# Patient Record
Sex: Female | Born: 1995 | Hispanic: No | Marital: Married | Smoking: Never smoker
Health system: Southern US, Community
[De-identification: ages and names within clinical notes are randomized; demographics above are authoritative.]

## PROBLEM LIST (undated history)

## (undated) DIAGNOSIS — E0590 Thyrotoxicosis, unspecified without thyrotoxic crisis or storm: Secondary | ICD-10-CM

## (undated) HISTORY — DX: Thyrotoxicosis, unspecified without thyrotoxic crisis or storm: E05.90

---

## 2015-06-25 ENCOUNTER — Ambulatory Visit (INDEPENDENT_AMBULATORY_CARE_PROVIDER_SITE_OTHER): Payer: 59 | Admitting: Obstetrics & Gynecology

## 2015-06-25 ENCOUNTER — Encounter: Payer: Self-pay | Admitting: Obstetrics & Gynecology

## 2015-06-25 ENCOUNTER — Telehealth: Payer: Self-pay | Admitting: *Deleted

## 2015-06-25 VITALS — BP 117/66 | HR 95 | Resp 16 | Ht 67.0 in | Wt 150.0 lb

## 2015-06-25 DIAGNOSIS — N926 Irregular menstruation, unspecified: Secondary | ICD-10-CM | POA: Diagnosis not present

## 2015-06-25 DIAGNOSIS — R102 Pelvic and perineal pain: Secondary | ICD-10-CM | POA: Diagnosis not present

## 2015-06-25 DIAGNOSIS — Z113 Encounter for screening for infections with a predominantly sexual mode of transmission: Secondary | ICD-10-CM | POA: Diagnosis not present

## 2015-06-25 DIAGNOSIS — Z124 Encounter for screening for malignant neoplasm of cervix: Secondary | ICD-10-CM

## 2015-06-25 LAB — POCT URINE PREGNANCY: Preg Test, Ur: NEGATIVE

## 2015-06-25 NOTE — Addendum Note (Signed)
Addended by: Granville LewisLARK, Vincy Feliz L on: 06/25/2015 02:22 PM   Modules accepted: Orders

## 2015-06-25 NOTE — Telephone Encounter (Signed)
error 

## 2015-06-25 NOTE — Progress Notes (Signed)
   CLINIC ENCOUNTER NOTE  History:  19 y.o. G0 here today for evaluation of an abnormal period this cycle and pelvic pain since she got married about 1.5 years ago.   Reports 28 day cycles, but had one this cycle after 39 days. This has never occurred before.  She also described occasional low pelvic pain since she got married.  Pain is not related to intercourse; she cannot pinpoint any alleviating or worsening factors.  It is mild-moderate in intensity and can last for several hours.  Does not take any medications.  Patient is also very anxious and wants to get pregnant soon.    History reviewed. No pertinent past medical history.  History reviewed. No pertinent past surgical history.  The following portions of the patient's history were reviewed and updated as appropriate: allergies, current medications, past family history, past medical history, past social history, past surgical history and problem list.   Health Maintenance:  Never had a pap smear  Review of Systems:  Pertinent items noted in HPI and remainder of comprehensive ROS otherwise negative.  Objective:  Physical Exam BP 117/66 mmHg  Pulse 95  Resp 16  Ht 5\' 7"  (1.702 m)  Wt 150 lb (68.04 kg)  BMI 23.49 kg/m2  LMP 05/30/2015 CONSTITUTIONAL: Well-developed, well-nourished female in no acute distress.  HENT:  Normocephalic, atraumatic. External right and left ear normal. Oropharynx is clear and moist EYES: Conjunctivae and EOM are normal. Pupils are equal, round, and reactive to light. No scleral icterus.  NECK: Normal range of motion, supple, no masses SKIN: Skin is warm and dry. No rash noted. Not diaphoretic. No erythema. No pallor. NEUROLGIC: Alert and oriented to person, place, and time. Normal reflexes, muscle tone coordination. No cranial nerve deficit noted. PSYCHIATRIC: Normal mood and affect. Normal behavior. Normal judgment and thought content. CARDIOVASCULAR: Normal heart rate noted RESPIRATORY: Effort and  breath sounds normal, no problems with respiration noted ABDOMEN: Soft, no distention noted.   PELVIC: Normal appearing external genitalia; normal appearing vaginal mucosa and cervix.  No abnormal discharge noted.  Normal uterine size, no other palpable masses, mild uterine and adnexal tenderness to palpation. MUSCULOSKELETAL: Normal range of motion. No edema noted.  UPT in clinic: Negative  Assessment & Plan:  1. Pelvic pain in female 2. Irregular periods Reassured about having one irregular period and she is not pregnant; does not warrant any further evaluation unless irregular periods persist.  Patient desires imaging for her pelvic pain, ultrasound ordered.   Also discussed use of ovulation predictor kits to help with conception. - US Pelvis Complete; Future - US Transvaginal Non-OB; Future - Cytology - PAP Will follow up all results and manage accordingly.   Total face-to-face time with patient: 15 minutes. Over 50% of encounter was spent on counseling and coordination of care.   Jaynie CollinsUGONNA  Monroe Toure, MD, FACOG Attending Obstetrician & Gynecologist, [facility]

## 2015-06-25 NOTE — Patient Instructions (Signed)
Return to clinic for any scheduled appointments or for any gynecologic concerns as needed.   

## 2015-06-26 ENCOUNTER — Other Ambulatory Visit: Payer: 59

## 2015-06-29 LAB — CYTOLOGY - PAP

## 2016-03-11 ENCOUNTER — Encounter: Payer: Self-pay | Admitting: Advanced Practice Midwife

## 2016-03-16 ENCOUNTER — Ambulatory Visit (INDEPENDENT_AMBULATORY_CARE_PROVIDER_SITE_OTHER): Payer: Medicaid Other | Admitting: Family Medicine

## 2016-03-16 ENCOUNTER — Encounter: Payer: Self-pay | Admitting: Family Medicine

## 2016-03-16 VITALS — BP 110/70 | HR 73 | Ht 66.0 in | Wt 162.0 lb

## 2016-03-16 DIAGNOSIS — O039 Complete or unspecified spontaneous abortion without complication: Secondary | ICD-10-CM | POA: Diagnosis not present

## 2016-03-16 DIAGNOSIS — Z3169 Encounter for other general counseling and advice on procreation: Secondary | ICD-10-CM | POA: Diagnosis not present

## 2016-03-16 MED ORDER — CONCEPT OB 130-92.4-1 MG PO CAPS: 1.0000 | ORAL_CAPSULE | Freq: Every day | ORAL | 11 refills | Status: DC

## 2016-03-16 NOTE — Progress Notes (Signed)
   Subjective:    Patient ID: Joan Jackson is a 20 y.o. female presenting with Follow-up  on 03/16/2016  HPI: [facility] IUGS noted then gone. She is no longer bleeding.  Review of Systems  Constitutional: Negative for chills and fever.  Respiratory: Negative for shortness of breath.   Cardiovascular: Negative for chest pain.  Gastrointestinal: Negative for abdominal pain, nausea and vomiting.  Genitourinary: Negative for dysuria.  Skin: Negative for rash.      Objective:    BP 110/70   Pulse 73   Ht 5\' 6"  (1.676 m)   Wt 162 lb (73.5 kg)   LMP 02/22/2016 (Exact Date)   BMI 26.15 kg/m  Physical Exam  Constitutional: She is oriented to person, place, and time. She appears well-developed and well-nourished. No distress.  HENT:  Head: Normocephalic and atraumatic.  Eyes: No scleral icterus.  Neck: Neck supple.  Cardiovascular: Normal rate.   Pulmonary/Chest: Effort normal.  Abdominal: Soft.  Neurological: She is alert and oriented to person, place, and time.  Skin: Skin is warm and dry.  Psychiatric: She has a normal mood and affect.        Assessment & Plan:   Problem List Items Addressed This Visit    None    Visit Diagnoses    Encounter for preconception consultation    -  Primary   Desires pregnancy soon--begin PNV's   Relevant Medications   Prenat w/o A Vit-FeFum-FePo-FA (CONCEPT OB) 130-92.4-1 MG CAPS   SAB (spontaneous abortion)          Total face-to-face time with patient: 15 minutes. Over 50% of encounter was spent on counseling and coordination of care. Return if symptoms worsen or fail to improve.  Joan Jackson 03/16/2016 10:47 AM

## 2016-03-16 NOTE — Patient Instructions (Addendum)
Preparing for Pregnancy Before trying to become pregnant, make an appointment with your health care provider (preconception care). The goal is to help you have a healthy, safe pregnancy. At your first appointment, your health care provider will:   Do a complete physical exam, including a Pap test.  Take a complete medical history.  Give you advice and help you resolve any problems. PRECONCEPTION CHECKLIST Here is a list of the basics to cover with your health care provider at your preconception visit:  Medical history.  Tell your health care provider about any diseases you have had. Many diseases can affect your pregnancy.  Include your partner's medical history and family history.  Make sure you have been tested for sexually transmitted infections (STIs). These can affect your pregnancy. In some cases, they can be passed to your baby. Tell your health care provider about any history of STIs.  Make sure your health care provider knows about any previous problems you have had with conception or pregnancy.  Tell your health care provider about any medicine you take. This includes herbal supplements and over-the-counter medicines.  Make sure all your immunizations are up to date. You may need to make additional appointments.  Ask your health care provider if you need any vaccinations or if there are any you should avoid.  Diet.  It is especially important to eat a healthy, balanced diet with the right nutrients when you are pregnant.  Ask your health care provider to help you get to a healthy weight before pregnancy.  If you are overweight, you are at higher risk for certain complications. These include high blood pressure, diabetes, and preterm birth.  If you are underweight, you are more likely to have a low-birth-weight baby.  Lifestyle.  Tell your health care provider about lifestyle factors such as alcohol use, drug use, or smoking.  Describe any harmful substances you may  be exposed to at work or home. These can include chemicals, pesticides, and radiation.  Mental health.  Let your health care provider know if you have been feeling depressed or anxious.  Let your health care provider know if you have a history of substance abuse.  Let your health care provider know if you do not feel safe at home. HOME INSTRUCTIONS TO PREPARE FOR PREGNANCY Follow your health care provider's advice and instructions.   Keep an accurate record of your menstrual periods. This makes it easier for your health care provider to determine your baby's due date.  Begin taking prenatal vitamins and folic acid supplements daily. Take them as directed by your health care provider.  Eat a balanced diet. Get help from a nutrition counselor if you have questions or need help.  Get regular exercise. Try to be active for at least 30 minutes a day most days of the week.  Quit smoking, if you smoke.  Do not drink alcohol.  Do not take illegal drugs.  Get medical problems, such as diabetes or high blood pressure, under control.  If you have diabetes, make sure you do the following:  Have good blood sugar control. If you have type 1 diabetes, use multiple daily doses of insulin. Do not use split-dose or premixed insulin.  Have an eye exam by a qualified eye care professional trained in caring for people with diabetes.  Get evaluated by your health care provider for cardiovascular disease.  Get to a healthy weight. If you are overweight or obese, reduce your weight with the help of a qualified health   professional such as a registered dietitian. Ask your health care provider what the right weight range is for you. HOW DO I KNOW I AM PREGNANT? You may be pregnant if you have been sexually active and you miss your period. Symptoms of early pregnancy include:   Mild cramping.  Very light vaginal bleeding (spotting).  Feeling unusually tired.  Morning sickness. If you have any of  these symptoms, take a home pregnancy test. These tests look for a hormone called human chorionic gonadotropin (hCG) in your urine. Your body begins to make this hormone during early pregnancy. These tests are very accurate. Wait until at least the first day you miss your period to take one. If you get a positive result, call your health care provider to make appointments for prenatal care. WHAT SHOULD I DO IF I BECOME PREGNANT?  Make an appointment with your health care provider by week 12 of your pregnancy at the latest.  Do not smoke. Smoking can be harmful to your baby.  Do not drink alcoholic beverages. Alcohol is related to a number of birth defects.  Avoid toxic odors and chemicals.  You may continue to have sexual intercourse if it does not cause pain or other problems, such as vaginal bleeding.   This information is not intended to replace advice given to you by your health care provider. Make sure you discuss any questions you have with your health care provider.   Document Released: 06/09/2008 Document Revised: 07/18/2014 Document Reviewed: 06/03/2013 Elsevier Interactive Patient Education 2016 Elsevier Inc.  

## 2017-01-30 ENCOUNTER — Telehealth: Payer: Self-pay | Admitting: *Deleted

## 2017-01-30 NOTE — Telephone Encounter (Signed)
Pt called stating that her LMP was 12/24/16.  She has taken a UPT which was neg.  Explained that sometimes women have an anovulatory cycle.  Appt made for 02/13/17 with Dr Penne LashLeggett.  If pt gets her period before the appt will call and cancel.

## 2017-02-13 ENCOUNTER — Ambulatory Visit: Payer: Medicaid Other | Admitting: Obstetrics & Gynecology

## 2017-02-28 ENCOUNTER — Ambulatory Visit (INDEPENDENT_AMBULATORY_CARE_PROVIDER_SITE_OTHER): Payer: BLUE CROSS/BLUE SHIELD | Admitting: Obstetrics and Gynecology

## 2017-02-28 ENCOUNTER — Encounter: Payer: Self-pay | Admitting: Obstetrics and Gynecology

## 2017-02-28 VITALS — BP 112/65 | HR 70 | Resp 16 | Ht 66.0 in | Wt 157.0 lb

## 2017-02-28 DIAGNOSIS — Z3169 Encounter for other general counseling and advice on procreation: Secondary | ICD-10-CM | POA: Diagnosis not present

## 2017-02-28 LAB — TSH: TSH: 2.12 mIU/L

## 2017-02-28 MED ORDER — VITAFOL ULTRA 29-0.6-0.4-200 MG PO CAPS: 1.0000 | ORAL_CAPSULE | Freq: Every day | ORAL | 12 refills | Status: DC

## 2017-02-28 NOTE — Progress Notes (Signed)
21 yo G1P0010 here for preconception counseling. Patient states that she was pregnant last year and experienced a miscarriage but was also told that she wasn't pregnant. Patient unclear as to what happened. Patient reports trying to conceive for the past year without success. She denies any medical problems. She is not a smoker. She reports monthly period lasting 5 days. Her cycles are 30-35 days long. She reports having intercourse 4-5 times per month. She states that her husband is also healthy and non smoker. She denies any pelvic pain or abnormal discharge. She also reports feeling extremely fatigued in the past few months and experiences heart palpitation occasionally which resolves spontaneously.  No past medical history on file. No past surgical history on file. Family History  Problem Relation Age of Onset  . Diabetes Mother   . Hypertension Mother   . Anemia Mother   . Diabetes Father   . Anemia Sister    Social History  Substance Use Topics  . Smoking status: Never Smoker  . Smokeless tobacco: Never Used  . Alcohol use No   ROS See pertinent in HPI  Blood pressure 112/65, pulse 70, resp. rate 16, height 5\' 6"  (1.676 m), weight 157 lb (71.2 kg), last menstrual period 02/17/2017. GENERAL: Well-developed, well-nourished female in no acute distress.  ABDOMEN: Soft, nontender, nondistended.  PELVIC: Not performed EXTREMITIES: No cyanosis, clubbing, or edema, 2+ distal pulses.  A/P 21 yo G1P0010 here for preconception counseling.  - Discussed tracking ovulation and timing of intercourse during the fertile period - Discussed trying for a year  - Rx prenatal vitamins provided - Will check TSH and A1c - Advised patient to follow up with PCP for evaluation of heart palpitation - RTC prn. Last pap smear was normal in 2016

## 2017-03-01 LAB — HEMOGLOBIN A1C
Hgb A1c MFr Bld: 5.4 % (ref <5.7)
Mean Plasma Glucose: 108 mg/dL

## 2017-04-11 ENCOUNTER — Ambulatory Visit (INDEPENDENT_AMBULATORY_CARE_PROVIDER_SITE_OTHER): Payer: BLUE CROSS/BLUE SHIELD | Admitting: Obstetrics & Gynecology

## 2017-04-11 ENCOUNTER — Encounter: Payer: Self-pay | Admitting: Obstetrics & Gynecology

## 2017-04-11 VITALS — BP 123/66 | HR 80 | Resp 16 | Ht 66.0 in | Wt 162.0 lb

## 2017-04-11 DIAGNOSIS — Z3202 Encounter for pregnancy test, result negative: Secondary | ICD-10-CM

## 2017-04-11 DIAGNOSIS — N912 Amenorrhea, unspecified: Secondary | ICD-10-CM | POA: Diagnosis not present

## 2017-04-11 LAB — POCT URINE PREGNANCY: Preg Test, Ur: NEGATIVE

## 2017-04-11 MED ORDER — MEDROXYPROGESTERONE ACETATE 10 MG PO TABS: 10.0000 mg | ORAL_TABLET | Freq: Every day | ORAL | 2 refills | Status: DC

## 2017-04-11 NOTE — Progress Notes (Signed)
   Subjective:    Patient ID: Joan Jackson, female    DOB: 1995/11/07, 21 y.o.   MRN: [id number]  HPI 21 yo M [place] G1P0A1 s/p spontaneous miscarriage 7/17 here today because she would like to be pregnant. Her LMP was 02-11-17. She is concerned because her cycles are lengthening. She has been trying to have a baby for 3 years.   Review of Systems She says that her husband had a semen analysis a while ago and it was normal.    Objective:   Physical Exam Well nourished, well hydrated Asian female, no apparent distress Breathing, conversing, and ambulating normally     Assessment & Plan:  She is taking a MVI Rec RE, She would like a female versus a female. Since I don't know of any female REs here she will do research and then call back and request a referral to her physician of choice. I offered to prescribe provera for a progestin challenge. I will send this to her pharmacy.

## 2017-05-30 ENCOUNTER — Telehealth: Payer: Self-pay | Admitting: *Deleted

## 2017-05-30 MED ORDER — MEDROXYPROGESTERONE ACETATE 10 MG PO TABS: 10.0000 mg | ORAL_TABLET | Freq: Every day | ORAL | 2 refills | Status: DC

## 2017-05-30 NOTE — Telephone Encounter (Signed)
RF given for Provera 10 mg.  Pt to do a preg test before starting Provera.  If she has a period she will call office to report what her next step is.

## 2017-06-14 ENCOUNTER — Telehealth: Payer: Self-pay | Admitting: *Deleted

## 2017-06-14 NOTE — Telephone Encounter (Signed)
Pt called to say that she did not have a bleed from the Provera 10x10.  Per Dr Marice Potterove recommend seeing an RE.  She prefers a female.  I gave her the [facility] numbers [facility], [facility] and Dr April MansonYalcinkaya.  If any needed a referral from us we would be glad to send one.

## 2019-02-25 ENCOUNTER — Encounter: Payer: BLUE CROSS/BLUE SHIELD | Admitting: Obstetrics & Gynecology

## 2019-07-22 DIAGNOSIS — E538 Deficiency of other specified B group vitamins: Secondary | ICD-10-CM | POA: Insufficient documentation

## 2019-07-22 DIAGNOSIS — E559 Vitamin D deficiency, unspecified: Secondary | ICD-10-CM | POA: Insufficient documentation

## 2020-06-11 DIAGNOSIS — R7301 Impaired fasting glucose: Secondary | ICD-10-CM | POA: Insufficient documentation

## 2020-07-11 NOTE — L&D Delivery Note (Signed)
OB/GYN Faculty Practice Delivery Note  Joan Jackson is a 25 y.o. G3P0020 s/p NSVD at [redacted]w[redacted]d. She was admitted for IOL for Post Dates.   ROM: unknown time with clear  fluid GBS Status: Negative Maximum Maternal Temperature: 101.6 during labor, treated with Ampicillin and Gentamycin  Labor Progress: Presented for IOL, received cytotec, progressed to fully, febrile to 101.6 and treated with antibiotics  Delivery Date/Time: 4967 on 03/01/2021 Delivery: Called to room and patient was complete and pushing. Head delivered. No nuchal cord present. Shoulder and body delivered in usual fashion. Infant with spontaneous cry, placed on mother's abdomen, dried and stimulated. Cord clamped x 2 after 1-minute delay, and cut by family member. Cord blood drawn. Placenta delivered spontaneously with gentle cord traction. Fundus firm with massage and Pitocin. Labia, perineum, vagina, and cervix inspected and a small labial tear was repaired with a 4.0 monocryl suture.  Of note, I was called in to stand by for delivery as provider Joan Jackson, CNM) caring for patient was called into a different patient's room. I delivered baby and stepped out and the placental delivery and repair was completed by Joan Jackson  Placenta: intact, 3v cord, to L&D Complications: Chorioamnionitis  Lacerations: Left labial EBL: 406 Analgesia: epidural   Infant: Girl  APGARs 9,9  3220g  Warner Mccreedy, MD, MPH OB Fellow, Faculty [facility] for [facility], [facility] 03/01/2021, 9:39 AM

## 2020-07-20 ENCOUNTER — Encounter: Payer: Self-pay | Admitting: *Deleted

## 2020-07-27 ENCOUNTER — Telehealth: Payer: Self-pay | Admitting: *Deleted

## 2020-07-27 NOTE — Telephone Encounter (Signed)
Could not get in contact with patient. Left message with sister to call and reschedule appointment due to office opening at 10:00 AM, if possible.

## 2020-07-28 ENCOUNTER — Ambulatory Visit (INDEPENDENT_AMBULATORY_CARE_PROVIDER_SITE_OTHER): Payer: Medicaid Other | Admitting: Advanced Practice Midwife

## 2020-07-28 DIAGNOSIS — Z3A10 10 weeks gestation of pregnancy: Secondary | ICD-10-CM

## 2020-07-29 ENCOUNTER — Encounter: Payer: Self-pay | Admitting: Obstetrics and Gynecology

## 2020-07-29 NOTE — Progress Notes (Signed)
Appt cancelled due to inclement weather

## 2020-07-29 NOTE — Progress Notes (Unsigned)
Pt thinks last pap was 2021- negative

## 2020-07-29 NOTE — Progress Notes (Unsigned)
Nausea/Vomiting and poor appetite

## 2020-07-30 ENCOUNTER — Other Ambulatory Visit: Payer: Self-pay

## 2020-07-30 ENCOUNTER — Other Ambulatory Visit (HOSPITAL_COMMUNITY)
Admission: RE | Admit: 2020-07-30 | Discharge: 2020-07-30 | Disposition: A | Discharge status: Home / Self Care | Payer: Medicaid Other | Source: Ambulatory Visit | Attending: Obstetrics and Gynecology | Admitting: Obstetrics and Gynecology

## 2020-07-30 ENCOUNTER — Ambulatory Visit (INDEPENDENT_AMBULATORY_CARE_PROVIDER_SITE_OTHER): Payer: Medicaid Other | Admitting: Obstetrics and Gynecology

## 2020-07-30 ENCOUNTER — Encounter: Payer: Self-pay | Admitting: Obstetrics and Gynecology

## 2020-07-30 VITALS — BP 134/76 | HR 112 | Wt 167.0 lb

## 2020-07-30 DIAGNOSIS — Z3A10 10 weeks gestation of pregnancy: Secondary | ICD-10-CM

## 2020-07-30 DIAGNOSIS — Z3480 Encounter for supervision of other normal pregnancy, unspecified trimester: Secondary | ICD-10-CM | POA: Insufficient documentation

## 2020-07-30 DIAGNOSIS — E039 Hypothyroidism, unspecified: Secondary | ICD-10-CM | POA: Insufficient documentation

## 2020-07-30 MED ORDER — DOXYLAMINE-PYRIDOXINE 10-10 MG PO TBEC: 2.0000 | DELAYED_RELEASE_TABLET | Freq: Every day | ORAL | 5 refills | Status: DC

## 2020-07-30 NOTE — Addendum Note (Signed)
Addended by: Granville Lewis on: 07/30/2020 11:19 AM   Modules accepted: Orders

## 2020-07-30 NOTE — Progress Notes (Signed)
Bedside U/S shows single IUP with FHT of 188 BPM and CRL measures 34.37mm  GA is [redacted]w[redacted]d

## 2020-07-30 NOTE — Progress Notes (Signed)
INITIAL PRENATAL VISIT NOTE  Subjective:  Coretta Leisey is a 25 y.o. G3P0020 at [redacted]w[redacted]d by LMP being seen today for her initial prenatal visit. This is a planned pregnancy. She and partner are happy with the pregnancy. She was using nothing for birth control previously. She has an obstetric history significant for 2 x SAB (? Reports one tubal pregnancy). She has a medical history significant for hypothyroidism.  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 Two miscarriages (reports one in tube that resolved on its own). Had trigger shot (HCG) only, and got pregnant with timed intercourse on the second month. Reports she has a history of irregular periods. Was on progesterone until 8 weeks.   Patient reports nausea. Reports some blood in her vomit, very small amount. Also with headache.   Contractions: Not present. Vag. Bleeding: None.  Movement: Absent. Denies leaking of fluid.    Past Medical History:  Diagnosis Date  . Hyperthyroidism     History reviewed. No pertinent surgical history.  OB History  Gravida Para Term Preterm AB Living  3 0 0 0 2 0  SAB IAB Ectopic Multiple Live Births  2 0 0 0      # Outcome Date GA Lbr Len/2nd Weight Sex Delivery Anes PTL Lv  3 Current           2 SAB           1 SAB             Social History   Socioeconomic History  . Marital status: Married    Spouse name: Not on file  . Number of children: Not on file  . Years of education: Not on file  . Highest education level: Not on file  Occupational History  . Occupation: Homemaker  Tobacco Use  . Smoking status: Never Smoker  . Smokeless tobacco: Never Used  Substance and Sexual Activity  . Alcohol use: No    Alcohol/week: 0.0 standard drinks  . Drug use: No  . Sexual activity: Yes    Partners: Male    Birth control/protection: None  Other Topics Concern  . Not on file  Social History Narrative  . Not on file   Social Determinants of Health   Financial Resource  Strain: Not on file  Food Insecurity: Not on file  Transportation Needs: Not on file  Physical Activity: Not on file  Stress: Not on file  Social Connections: Not on file    Family History  Problem Relation Age of Onset  . Diabetes Mother   . Hypertension Mother   . Anemia Mother   . Diabetes Father   . Anemia Sister     Current Outpatient Medications:  .  Doxylamine-Pyridoxine (DICLEGIS) 10-10 MG TBEC, Take 2 tablets by mouth at bedtime. If symptoms persist, add one tablet in the morning and one in the afternoon, Disp: 100 tablet, Rfl: 5 .  levothyroxine (SYNTHROID) 25 MCG tablet, Take by mouth., Disp: , Rfl:  .  Prenat-Fe Poly-Methfol-FA-DHA (VITAFOL ULTRA) 29-0.6-0.4-200 MG CAPS, Take 1 tablet by mouth daily., Disp: 30 capsule, Rfl: 12 .  Vitamin D, Ergocalciferol, (DRISDOL) 1.25 MG (50000 UNIT) CAPS capsule, SMARTSIG:1 Capsule(s) By Mouth, Disp: , Rfl:   Allergies  Allergen Reactions  . Bee Venom Swelling    Review of Systems: Negative except for what is mentioned in HPI.  Objective:   Vitals:   07/29/20 1332  BP: 134/76  Pulse: (!) 112  Weight: 167  lb (75.8 kg)    Fetal Status: Fetal Heart Rate (bpm): 188   Movement: Absent     Physical Exam: BP 134/76   Pulse (!) 112   Wt 167 lb (75.8 kg)   LMP 05/18/2020   BMI 26.95 kg/m  CONSTITUTIONAL: Well-developed, well-nourished female in no acute distress.  NEUROLOGIC: Alert and oriented to person, place, and time. Normal reflexes, muscle tone coordination. No cranial nerve deficit noted. PSYCHIATRIC: Normal mood and affect. Normal behavior. Normal judgment and thought content. SKIN: Skin is warm and dry. No rash noted. Not diaphoretic. No erythema. No pallor. HENT:  Normocephalic, atraumatic, External right and left ear normal. Oropharynx is clear and moist EYES: Conjunctivae and EOM are normal. Pupils are equal, round, and reactive to light. No scleral icterus.  NECK: Normal range of motion, supple, no  masses CARDIOVASCULAR: Normal heart rate noted, regular rhythm RESPIRATORY: Effort and breath sounds normal, no problems with respiration noted BREASTS: symmetric, non-tender, no masses palpable ABDOMEN: Soft, nontender, nondistended, gravid. GU: normal appearing external female genitalia, nulliparous normal appearing cervix, scant white discharge in vagina, no lesions noted Bimanual: 10 weeks sized uterus, no adnexal tenderness or palpable lesions noted MUSCULOSKELETAL: Normal range of motion. EXT:  No edema and no tenderness. 2+ distal pulses.   Assessment and Plan:  Pregnancy: G3P0020 at [redacted]w[redacted]d by LMP  1. Supervision of other normal pregnancy, antepartum - Obstetric panel - HIV antibody (with reflex) - Hepatitis C Antibody - Culture, OB Urine - Hemoglobinopathy Evaluation - GC/Chlamydia probe amp ([place])not at [facility] - [place] bedside; Future - CHL AMB BABYSCRIPTS OPT IN - TSH - defers genetic testing until next visit - defers pap until post partum - declines flu until next time - has had 2 COVID vaccines, not had booster, recommended - Reviewed [facility] for [employer] structure, multiple providers, fellows, medical students, virtual visits, MyChart.  - reviewed that she may be taken care of by female provider, physician at hospital  2. [redacted] weeks gestation of pregnancy  3. Hypothyroidism, unspecified type - TSH - [place] MFM OB DETAIL +14 WK; Future   Preterm labor symptoms and general obstetric precautions including but not limited to vaginal bleeding, contractions, leaking of fluid and fetal movement were reviewed in detail with the patient.  Please refer to After Visit Summary for other counseling recommendations.   Return in about 4 weeks (around 08/27/2020) for high OB, in person.  Conan Bowens 07/30/2020 11:16 AM

## 2020-07-31 LAB — GC/CHLAMYDIA PROBE AMP (~~LOC~~) NOT AT ARMC
Chlamydia: NEGATIVE
Comment: NEGATIVE
Comment: NORMAL
Neisseria Gonorrhea: NEGATIVE

## 2020-08-03 LAB — OBSTETRIC PANEL
Absolute Monocytes: 837 cells/uL (ref 200–950)
Antibody Screen: NOT DETECTED
Basophils Absolute: 46 cells/uL (ref 0–200)
Basophils Relative: 0.5 %
Eosinophils Absolute: 246 cells/uL (ref 15–500)
Eosinophils Relative: 2.7 %
HCT: 39.1 % (ref 35.0–45.0)
Hemoglobin: 13.3 g/dL (ref 11.7–15.5)
Hepatitis B Surface Ag: NONREACTIVE
Lymphs Abs: 1629 cells/uL (ref 850–3900)
MCH: 28.4 pg (ref 27.0–33.0)
MCHC: 34 g/dL (ref 32.0–36.0)
MCV: 83.4 fL (ref 80.0–100.0)
MPV: 11.2 fL (ref 7.5–12.5)
Monocytes Relative: 9.2 %
Neutro Abs: 6343 cells/uL (ref 1500–7800)
Neutrophils Relative %: 69.7 %
Platelets: 294 10*3/uL (ref 140–400)
RBC: 4.69 10*6/uL (ref 3.80–5.10)
RDW: 12.8 % (ref 11.0–15.0)
RPR Ser Ql: NONREACTIVE
Rubella: 10.2 Index
Total Lymphocyte: 17.9 %
WBC: 9.1 10*3/uL (ref 3.8–10.8)

## 2020-08-03 LAB — HEPATITIS C ANTIBODY
Hepatitis C Ab: NONREACTIVE
SIGNAL TO CUT-OFF: 0.01 (ref <1.00)

## 2020-08-03 LAB — TSH: TSH: 2.26 mIU/L

## 2020-08-03 LAB — HIV ANTIBODY (ROUTINE TESTING W REFLEX): HIV 1&2 Ab, 4th Generation: NONREACTIVE

## 2020-08-03 LAB — HEMOGLOBINOPATHY EVALUATION
Fetal Hemoglobin Testing: 0.3 % (ref 0.0–1.9)
HCT: 41 % (ref 35.0–45.0)
Hemoglobin A2 - HGBRFX: 3 % (ref 2.2–3.2)
Hemoglobin: 13.4 g/dL (ref 11.7–15.5)
Hgb A: 96.7 % (ref >96.0)
MCH: 27.7 pg (ref 27.0–33.0)
MCV: 84.7 fL (ref 80.0–100.0)
RBC: 4.84 10*6/uL (ref 3.80–5.10)
RDW: 12.9 % (ref 11.0–15.0)

## 2020-08-06 ENCOUNTER — Telehealth: Payer: Self-pay | Admitting: *Deleted

## 2020-08-06 MED ORDER — LEVOTHYROXINE SODIUM 25 MCG PO TABS: 25.0000 ug | ORAL_TABLET | Freq: Every day | ORAL | 6 refills | Status: DC

## 2020-08-06 NOTE — Telephone Encounter (Signed)
Pt notified of normal TSH and per Dr Earlene Plater she is to stay on the current dose of Synthroid .  RF sent to [facility] in [place]

## 2020-08-27 ENCOUNTER — Ambulatory Visit (INDEPENDENT_AMBULATORY_CARE_PROVIDER_SITE_OTHER): Payer: Medicaid Other | Admitting: Obstetrics and Gynecology

## 2020-08-27 ENCOUNTER — Other Ambulatory Visit: Payer: Self-pay

## 2020-08-27 ENCOUNTER — Encounter: Payer: Self-pay | Admitting: Obstetrics and Gynecology

## 2020-08-27 VITALS — BP 121/71 | HR 86 | Wt 171.0 lb

## 2020-08-27 DIAGNOSIS — E039 Hypothyroidism, unspecified: Secondary | ICD-10-CM

## 2020-08-27 DIAGNOSIS — Z3480 Encounter for supervision of other normal pregnancy, unspecified trimester: Secondary | ICD-10-CM

## 2020-08-27 NOTE — Progress Notes (Signed)
   PRENATAL VISIT NOTE  Subjective:  Joan Jackson is a 25 y.o. G3P0020 at [redacted]w[redacted]d being seen today for ongoing prenatal care.  She is currently monitored for the following issues for this low-risk pregnancy and has Supervision of other normal pregnancy, antepartum and Hypothyroidism on their problem list.  Patient reports nausea has improved.  Contractions: Not present. Vag. Bleeding: None.  Movement: Absent. Denies leaking of fluid.   The following portions of the patient's history were reviewed and updated as appropriate: allergies, current medications, past family history, past medical history, past social history, past surgical history and problem list.   Objective:   Vitals:   08/27/20 0940  BP: 121/71  Pulse: 86  Weight: 171 lb (77.6 kg)    Fetal Status: Fetal Heart Rate (bpm): 155   Movement: Absent     General:  Alert, oriented and cooperative. Patient is in no acute distress.  Skin: Skin is warm and dry. No rash noted.   Cardiovascular: Normal heart rate noted  Respiratory: Normal respiratory effort, no problems with respiration noted  Abdomen: Soft, gravid, appropriate for gestational age.  Pain/Pressure: Absent     Pelvic: Cervical exam deferred        Extremities: Normal range of motion.  Edema: None  Mental Status: Normal mood and affect. Normal behavior. Normal judgment and thought content.   Assessment and Plan:  Pregnancy: G3P0020 at [redacted]w[redacted]d  1. Hypothyroidism, unspecified type Cont current dose synthroid  2. Supervision of other normal pregnancy, antepartum Panorama today Reviewed pregnancy symptoms, round ligament pain, back pain expectations Encouraged light exercise  Preterm labor symptoms and general obstetric precautions including but not limited to vaginal bleeding, contractions, leaking of fluid and fetal movement were reviewed in detail with the patient. Please refer to After Visit Summary for other counseling recommendations.   Return in about 4 weeks  (around 09/24/2020) for in person, low OB.  Future Appointments  Date Time Provider Department Center  09/24/2020  3:00 PM Lesly Dukes, MD [facility]    Conan Bowens, MD

## 2020-08-27 NOTE — Progress Notes (Signed)
Panorama drawn

## 2020-08-28 LAB — URINE CULTURE
MICRO NUMBER:: 11547000
Result:: NO GROWTH
SPECIMEN QUALITY:: ADEQUATE

## 2020-09-03 ENCOUNTER — Encounter: Payer: Self-pay | Admitting: *Deleted

## 2020-09-03 DIAGNOSIS — Z3480 Encounter for supervision of other normal pregnancy, unspecified trimester: Secondary | ICD-10-CM

## 2020-09-10 ENCOUNTER — Telehealth: Payer: Self-pay | Admitting: *Deleted

## 2020-09-10 NOTE — Telephone Encounter (Signed)
Patient wanted to know what medication she is able to take in her 2nd trimester for nasal congestion. Patient advised to take Mucinex-D and or a saline drops, not a nasal spray. Patient also stated that she has been having issues with her blood pressure, but does not know what the normal range is. Patient advised to take BP after 15 minutes of resting/sitting and call back for reading. Patient called back at 10:45 AM with BP reading of 128/74 and 90 pulse. Explained to patient what can make a BP abnormal and what a normal BP is. Patient agreed to all stated.

## 2020-09-14 ENCOUNTER — Encounter: Payer: Self-pay | Admitting: *Deleted

## 2020-09-14 DIAGNOSIS — Z3480 Encounter for supervision of other normal pregnancy, unspecified trimester: Secondary | ICD-10-CM

## 2020-09-21 ENCOUNTER — Telehealth: Payer: Self-pay | Admitting: *Deleted

## 2020-09-21 ENCOUNTER — Other Ambulatory Visit: Payer: Self-pay | Admitting: Obstetrics and Gynecology

## 2020-09-21 MED ORDER — LEVOTHYROXINE SODIUM 25 MCG PO TABS: 25.0000 ug | ORAL_TABLET | Freq: Every day | ORAL | 3 refills | Status: DC

## 2020-09-21 NOTE — Telephone Encounter (Signed)
pT'S rx FOR sYNTHROID CHANGED TO TAKE 25 MG DAILY EXCEPT ON tUESDAY AND sAT SHE IS TO TAKE 50 MG.

## 2020-09-24 ENCOUNTER — Ambulatory Visit (INDEPENDENT_AMBULATORY_CARE_PROVIDER_SITE_OTHER): Payer: Medicaid Other | Admitting: Obstetrics & Gynecology

## 2020-09-24 ENCOUNTER — Other Ambulatory Visit: Payer: Self-pay

## 2020-09-24 VITALS — BP 130/65 | HR 93 | Wt 172.0 lb

## 2020-09-24 DIAGNOSIS — E039 Hypothyroidism, unspecified: Secondary | ICD-10-CM

## 2020-09-24 DIAGNOSIS — Z3480 Encounter for supervision of other normal pregnancy, unspecified trimester: Secondary | ICD-10-CM

## 2020-09-24 MED ORDER — LEVOTHYROXINE SODIUM 50 MCG PO TABS: ORAL_TABLET | ORAL | 3 refills | Status: DC

## 2020-09-24 MED ORDER — LEVOTHYROXINE SODIUM 25 MCG PO CAPS: 1.0000 | ORAL_CAPSULE | Freq: Every day | ORAL | 6 refills | Status: DC

## 2020-09-24 NOTE — Progress Notes (Signed)
   PRENATAL VISIT NOTE  Subjective:  Joan Jackson is a 25 y.o. G3P0020 at [redacted]w[redacted]d being seen today for ongoing prenatal care.  She is currently monitored for the following issues for this low-risk pregnancy and has Supervision of other normal pregnancy, antepartum and Hypothyroidism on their problem list.  Patient reports no complaints.  Contractions: Not present. Vag. Bleeding: None.  Movement: Absent. Denies leaking of fluid.   The following portions of the patient's history were reviewed and updated as appropriate: allergies, current medications, past family history, past medical history, past social history, past surgical history and problem list.   Objective:   Vitals:   09/24/20 1453  BP: 130/65  Pulse: 93  Weight: 172 lb (78 kg)    Fetal Status: Fetal Heart Rate (bpm): 146   Movement: Absent     General:  Alert, oriented and cooperative. Patient is in no acute distress.  Skin: Skin is warm and dry. No rash noted.   Cardiovascular: Normal heart rate noted  Respiratory: Normal respiratory effort, no problems with respiration noted  Abdomen: Soft, gravid, appropriate for gestational age.  Pain/Pressure: Absent     Pelvic: Cervical exam deferred        Extremities: Normal range of motion.  Edema: None  Mental Status: Normal mood and affect. Normal behavior. Normal judgment and thought content.   Assessment and Plan:  Pregnancy: G3P0020 at [redacted]w[redacted]d 1. Hypothyroidism, unspecified type -Dr. Jeannie Fend has her on 50 mcg Tuesdays and Saturdays and 25 mcg every other day.  This was refilled.   2. Supervision of other normal pregnancy, antepartum - Alpha fetoprotein, maternal - [place] ordered 10/02/20 -Pt to download Baby Rx  Preterm labor symptoms and general obstetric precautions including but not limited to vaginal bleeding, contractions, leaking of fluid and fetal movement were reviewed in detail with the patient. Please refer to After Visit Summary for other counseling recommendations.   No  follow-ups on file.  Future Appointments  Date Time Provider Department Center  09/24/2020  3:00 PM Lesly Dukes, MD [facility]  10/02/2020  9:15 AM [facility] NURSE [facility]  10/02/2020  9:30 AM [facility] US3 [facility]    Elsie Lincoln, MD

## 2020-09-25 LAB — ALPHA FETOPROTEIN, MATERNAL
AFP MoM: 0.48
AFP, Serum: 20.9 ng/mL
Calc'd Gestational Age: 18.4 weeks
Maternal Wt: 172 [lb_av]
Risk for ONTD: <1
Twins-AFP: 1

## 2020-09-30 ENCOUNTER — Other Ambulatory Visit: Payer: Medicaid Other

## 2020-09-30 ENCOUNTER — Ambulatory Visit: Payer: Medicaid Other

## 2020-10-02 ENCOUNTER — Ambulatory Visit (HOSPITAL_BASED_OUTPATIENT_CLINIC_OR_DEPARTMENT_OTHER): Payer: Medicaid Other

## 2020-10-02 ENCOUNTER — Other Ambulatory Visit: Payer: Self-pay | Admitting: *Deleted

## 2020-10-02 ENCOUNTER — Encounter: Payer: Self-pay | Admitting: *Deleted

## 2020-10-02 ENCOUNTER — Ambulatory Visit: Payer: Medicaid Other | Attending: Obstetrics and Gynecology | Admitting: *Deleted

## 2020-10-02 ENCOUNTER — Other Ambulatory Visit: Payer: Self-pay

## 2020-10-02 VITALS — BP 125/69 | HR 78

## 2020-10-02 DIAGNOSIS — O99282 Endocrine, nutritional and metabolic diseases complicating pregnancy, second trimester: Secondary | ICD-10-CM | POA: Diagnosis not present

## 2020-10-02 DIAGNOSIS — Z3A19 19 weeks gestation of pregnancy: Secondary | ICD-10-CM | POA: Insufficient documentation

## 2020-10-02 DIAGNOSIS — E039 Hypothyroidism, unspecified: Secondary | ICD-10-CM

## 2020-10-02 DIAGNOSIS — Z363 Encounter for antenatal screening for malformations: Secondary | ICD-10-CM | POA: Insufficient documentation

## 2020-10-02 DIAGNOSIS — Z362 Encounter for other antenatal screening follow-up: Secondary | ICD-10-CM

## 2020-10-19 ENCOUNTER — Telehealth: Payer: Self-pay | Admitting: *Deleted

## 2020-10-19 NOTE — Telephone Encounter (Signed)
Patient called to make sure that it is okay for her to take Miralex for constipation. Patient advised to take as directed on the bottle and that it might take a few days to work. Patient agreed to plan of care.

## 2020-10-22 ENCOUNTER — Other Ambulatory Visit: Payer: Self-pay | Admitting: *Deleted

## 2020-10-22 MED ORDER — LEVOTHYROXINE SODIUM 25 MCG PO CAPS: ORAL_CAPSULE | ORAL | 6 refills | Status: DC

## 2020-10-29 ENCOUNTER — Other Ambulatory Visit: Payer: Self-pay

## 2020-10-29 ENCOUNTER — Ambulatory Visit (INDEPENDENT_AMBULATORY_CARE_PROVIDER_SITE_OTHER): Payer: Medicaid Other | Admitting: Obstetrics and Gynecology

## 2020-10-29 ENCOUNTER — Encounter: Payer: Self-pay | Admitting: Obstetrics and Gynecology

## 2020-10-29 VITALS — BP 115/73 | HR 79 | Wt 168.0 lb

## 2020-10-29 DIAGNOSIS — Z3480 Encounter for supervision of other normal pregnancy, unspecified trimester: Secondary | ICD-10-CM

## 2020-10-29 DIAGNOSIS — Z3A23 23 weeks gestation of pregnancy: Secondary | ICD-10-CM

## 2020-10-29 DIAGNOSIS — E039 Hypothyroidism, unspecified: Secondary | ICD-10-CM

## 2020-10-29 LAB — TSH: TSH: 3.25 mIU/L

## 2020-10-29 NOTE — Progress Notes (Signed)
   PRENATAL VISIT NOTE  Subjective:  Joan Jackson is a 25 y.o. G3P0020 at [redacted]w[redacted]d being seen today for ongoing prenatal care.  She is currently monitored for the following issues for this high-risk pregnancy and has Supervision of other normal pregnancy, antepartum and Hypothyroidism on their problem list.  Patient reports no complaints.  Contractions: Not present. Vag. Bleeding: None.  Movement: Present. Denies leaking of fluid.   The following portions of the patient's history were reviewed and updated as appropriate: allergies, current medications, past family history, past medical history, past social history, past surgical history and problem list.   Objective:   Vitals:   10/29/20 0910  BP: 115/73  Pulse: 79  Weight: 168 lb (76.2 kg)    Fetal Status: Fetal Heart Rate (bpm): 147   Movement: Present     General:  Alert, oriented and cooperative. Patient is in no acute distress.  Skin: Skin is warm and dry. No rash noted.   Cardiovascular: Normal heart rate noted  Respiratory: Normal respiratory effort, no problems with respiration noted  Abdomen: Soft, gravid, appropriate for gestational age.  Pain/Pressure: Present     Pelvic: Cervical exam deferred        Extremities: Normal range of motion.  Edema: None  Mental Status: Normal mood and affect. Normal behavior. Normal judgment and thought content.   Assessment and Plan:  Pregnancy: G3P0020 at 108w3d 1. Hypothyroidism, unspecified type - TSH  2. Supervision of other normal pregnancy, antepartum Reviewed expectations for 2nd trimester, answered all questions She is nervous about possibly needing a c-section, reviewed indications  3. [redacted] weeks gestation of pregnancy  Preterm labor symptoms and general obstetric precautions including but not limited to vaginal bleeding, contractions, leaking of fluid and fetal movement were reviewed in detail with the patient. Please refer to After Visit Summary for other counseling  recommendations.   Return in about 4 weeks (around 11/26/2020) for 3rd trim labs, 2 hr GTT, high OB, in person.  Future Appointments  Date Time Provider Department Center  11/18/2020  9:30 AM [facility] NURSE [facility]  11/18/2020  9:45 AM [facility] US6 [facility]  11/23/2020  8:30 AM Leggett, Fredrich Romans, MD [facility]    Conan Bowens, MD

## 2020-10-31 MED ORDER — LEVOTHYROXINE SODIUM 75 MCG PO TABS: 75.0000 ug | ORAL_TABLET | Freq: Every day | ORAL | 3 refills | Status: DC

## 2020-10-31 NOTE — Addendum Note (Signed)
Addended by: Leroy Libman on: 10/31/2020 08:39 AM   Modules accepted: Orders

## 2020-11-02 ENCOUNTER — Ambulatory Visit: Payer: Medicaid Other

## 2020-11-03 ENCOUNTER — Telehealth: Payer: Self-pay | Admitting: *Deleted

## 2020-11-03 NOTE — Telephone Encounter (Signed)
Patient wanted to know what she can take for a cough in her 2nd trimester. Patient advised to take Robitussin DM. Patient agreed.

## 2020-11-16 ENCOUNTER — Telehealth: Payer: Self-pay | Admitting: *Deleted

## 2020-11-16 MED ORDER — LEVOTHYROXINE SODIUM 75 MCG PO TABS: 75.0000 ug | ORAL_TABLET | Freq: Every day | ORAL | 3 refills | Status: DC

## 2020-11-16 NOTE — Telephone Encounter (Signed)
RF sent to [facility] [address] for Syndthroid 75 mcg

## 2020-11-18 ENCOUNTER — Ambulatory Visit: Payer: Medicaid Other | Admitting: *Deleted

## 2020-11-18 ENCOUNTER — Ambulatory Visit: Payer: Medicaid Other | Attending: Obstetrics and Gynecology

## 2020-11-18 ENCOUNTER — Other Ambulatory Visit: Payer: Self-pay | Admitting: *Deleted

## 2020-11-18 ENCOUNTER — Other Ambulatory Visit: Payer: Self-pay

## 2020-11-18 ENCOUNTER — Encounter: Payer: Self-pay | Admitting: *Deleted

## 2020-11-18 VITALS — BP 132/63 | HR 75

## 2020-11-18 DIAGNOSIS — E039 Hypothyroidism, unspecified: Secondary | ICD-10-CM | POA: Diagnosis present

## 2020-11-18 DIAGNOSIS — Z362 Encounter for other antenatal screening follow-up: Secondary | ICD-10-CM | POA: Diagnosis present

## 2020-11-23 ENCOUNTER — Other Ambulatory Visit: Payer: Self-pay

## 2020-11-23 ENCOUNTER — Ambulatory Visit (INDEPENDENT_AMBULATORY_CARE_PROVIDER_SITE_OTHER): Payer: Medicaid Other | Admitting: Obstetrics & Gynecology

## 2020-11-23 VITALS — BP 113/64 | HR 84 | Wt 170.0 lb

## 2020-11-23 DIAGNOSIS — Z3480 Encounter for supervision of other normal pregnancy, unspecified trimester: Secondary | ICD-10-CM

## 2020-11-23 DIAGNOSIS — Z3482 Encounter for supervision of other normal pregnancy, second trimester: Secondary | ICD-10-CM

## 2020-11-23 DIAGNOSIS — R0782 Intercostal pain: Secondary | ICD-10-CM

## 2020-11-23 DIAGNOSIS — Z3A27 27 weeks gestation of pregnancy: Secondary | ICD-10-CM | POA: Diagnosis not present

## 2020-11-23 MED ORDER — LEVOTHYROXINE SODIUM 25 MCG PO TABS: ORAL_TABLET | ORAL | 3 refills | Status: DC

## 2020-11-23 NOTE — Progress Notes (Signed)
Undecided about Tdap- info given- pt states she might get at next visit    PRENATAL VISIT NOTE  Subjective:  Joan Jackson is a 25 y.o. G3P0020 at [redacted]w[redacted]d being seen today for ongoing prenatal care.  She is currently monitored for the following issues for this low-risk pregnancy and has Supervision of other normal pregnancy, antepartum and Hypothyroidism on their problem list.  Patient reports reproducible pain beneath sternum, left > right.  NO SOB or pain on deep inspriation.  Contractions: Not present. Vag. Bleeding: None.  Movement: Present. Denies leaking of fluid.   The following portions of the patient's history were reviewed and updated as appropriate: allergies, current medications, past family history, past medical history, past social history, past surgical history and problem list.   Objective:   Vitals:   11/23/20 0833  BP: 113/64  Pulse: 84  Weight: 170 lb (77.1 kg)    Fetal Status: Fetal Heart Rate (bpm): 140 Fundal Height: 28 cm Movement: Present     General:  Alert, oriented and cooperative. Patient is in no acute distress.  Skin: Skin is warm and dry. No rash noted.   Cardiovascular: Normal heart rate noted  Respiratory: Normal respiratory effort, no problems with respiration noted; CTA bilaterally  Abdomen: Soft, gravid, appropriate for gestational age.  Pain/Pressure: Present     Pelvic: Cervical exam deferred        Extremities: Normal range of motion.  Edema: None  Mental Status: Normal mood and affect. Normal behavior. Normal judgment and thought content.   Assessment and Plan:  Pregnancy: G3P0020 at [redacted]w[redacted]d 1. Supervision of other normal pregnancy, antepartum - 2Hr GTT w/ 1 Hr Carpenter 75 g - HIV antibody (with reflex) - CBC - RPR  2.  Hypothyroidism in pregnancy--patient's TSH level was elevated for pregnancy in the second trimester.  Patient currently on 75 mcg/day.  We will alternate 75 mcg and 100 mcg every other day to help with her hypothyroidism.   Patient also scheduled for MFM ultrasound for follow-up growth.  Repeat TSH in June.  3.  Costochondritis in pregnancy.  We will try a short course of ibuprofen.  Patient to take 400 mg twice a day for 3 days to see if this helps her pain.  Pt to use ice packs to chest wall.   She is to report to [place] that her pain is not improved.  Preterm labor symptoms and general obstetric precautions including but not limited to vaginal bleeding, contractions, leaking of fluid and fetal movement were reviewed in detail with the patient. Please refer to After Visit Summary for other counseling recommendations.   Return in about 3 weeks (around 12/14/2020).  Future Appointments  Date Time Provider Department Center  12/14/2020  9:45 AM Lesly Dukes, MD [facility]  01/13/2021  9:30 AM [facility]SE [facility]  01/13/2021  9:45 AM [facility] US5 [facility]    Elsie Lincoln, MD

## 2020-11-24 LAB — 2HR GTT W 1 HR, CARPENTER, 75 G
Glucose, 1 Hr, Gest: 138 mg/dL (ref 65–179)
Glucose, 2 Hr, Gest: 133 mg/dL (ref 65–152)
Glucose, Fasting, Gest: 70 mg/dL (ref 65–91)

## 2020-11-24 LAB — CBC
HCT: 37.1 % (ref 35.0–45.0)
Hemoglobin: 12.2 g/dL (ref 11.7–15.5)
MCH: 28.8 pg (ref 27.0–33.0)
MCHC: 32.9 g/dL (ref 32.0–36.0)
MCV: 87.5 fL (ref 80.0–100.0)
MPV: 11.3 fL (ref 7.5–12.5)
Platelets: 208 10*3/uL (ref 140–400)
RBC: 4.24 10*6/uL (ref 3.80–5.10)
RDW: 13.1 % (ref 11.0–15.0)
WBC: 8.6 10*3/uL (ref 3.8–10.8)

## 2020-11-24 LAB — RPR: RPR Ser Ql: NONREACTIVE

## 2020-11-24 LAB — HIV ANTIBODY (ROUTINE TESTING W REFLEX): HIV 1&2 Ab, 4th Generation: NONREACTIVE

## 2020-11-26 ENCOUNTER — Other Ambulatory Visit: Payer: Self-pay | Admitting: *Deleted

## 2020-11-26 MED ORDER — LEVOTHYROXINE SODIUM 100 MCG PO TABS: ORAL_TABLET | ORAL | 1 refills | Status: DC

## 2020-12-14 ENCOUNTER — Other Ambulatory Visit: Payer: Self-pay

## 2020-12-14 ENCOUNTER — Ambulatory Visit (INDEPENDENT_AMBULATORY_CARE_PROVIDER_SITE_OTHER): Payer: Medicaid Other | Admitting: Obstetrics & Gynecology

## 2020-12-14 VITALS — BP 113/64 | HR 91 | Wt 172.0 lb

## 2020-12-14 DIAGNOSIS — Z3480 Encounter for supervision of other normal pregnancy, unspecified trimester: Secondary | ICD-10-CM

## 2020-12-14 DIAGNOSIS — Z23 Encounter for immunization: Secondary | ICD-10-CM | POA: Diagnosis not present

## 2020-12-14 DIAGNOSIS — Z3483 Encounter for supervision of other normal pregnancy, third trimester: Secondary | ICD-10-CM

## 2020-12-14 DIAGNOSIS — Z3A30 30 weeks gestation of pregnancy: Secondary | ICD-10-CM

## 2020-12-14 MED ORDER — VITAMIN D 50 MCG (2000 UT) PO TABS: 2000.0000 [IU] | ORAL_TABLET | Freq: Every day | ORAL | 6 refills | Status: DC

## 2020-12-14 MED ORDER — VITAMIN B-12 1000 MCG PO TABS: 1000.0000 ug | ORAL_TABLET | Freq: Every day | ORAL | 6 refills | Status: DC

## 2020-12-14 NOTE — Progress Notes (Signed)
   PRENATAL VISIT NOTE  Subjective:  Joan Jackson is a 25 y.o. G3P0020 at [redacted]w[redacted]d being seen today for ongoing prenatal care.  She is currently monitored for the following issues for this low-risk pregnancy and has Supervision of other normal pregnancy, antepartum and Hypothyroidism on their problem list.  Patient reports no complaints.  Contractions: Not present. Vag. Bleeding: None.  Movement: Present. Denies leaking of fluid.   The following portions of the patient's history were reviewed and updated as appropriate: allergies, current medications, past family history, past medical history, past social history, past surgical history and problem list.   Objective:   Vitals:   12/14/20 0909  BP: 113/64  Pulse: 91  Weight: 172 lb (78 kg)    Fetal Status: Fetal Heart Rate (bpm): 155   Movement: Present     General:  Alert, oriented and cooperative. Patient is in no acute distress.  Skin: Skin is warm and dry. No rash noted.   Cardiovascular: Normal heart rate noted  Respiratory: Normal respiratory effort, no problems with respiration noted  Abdomen: Soft, gravid, appropriate for gestational age.  Pain/Pressure: Present     Pelvic: Cervical exam deferred        Extremities: Normal range of motion.  Edema: None  Mental Status: Normal mood and affect. Normal behavior. Normal judgment and thought content.   Assessment and Plan:  Pregnancy: G3P0020 at [redacted]w[redacted]d 1. Vit D and b 12 levels low 2000 units Vit D and 1000 mcg b12  2. Supervision of other normal pregnancy, antepartum Has f/u US this month TSH next visit Tdap today  Preterm labor symptoms and general obstetric precautions including but not limited to vaginal bleeding, contractions, leaking of fluid and fetal movement were reviewed in detail with the patient. Please refer to After Visit Summary for other counseling recommendations.   No follow-ups on file.  Future Appointments  Date Time Provider Department Center  12/14/2020   9:45 AM Lesly Dukes, MD [facility]  01/13/2021  9:30 AM [facility] NURSE [facility]  01/13/2021  9:45 AM [facility] US5 [facility]    Elsie Lincoln, MD

## 2020-12-25 ENCOUNTER — Other Ambulatory Visit: Payer: Self-pay | Admitting: *Deleted

## 2020-12-25 MED ORDER — LEVOTHYROXINE SODIUM 100 MCG PO TABS: ORAL_TABLET | ORAL | 2 refills | Status: DC

## 2020-12-29 ENCOUNTER — Encounter: Payer: Medicaid Other | Admitting: Advanced Practice Midwife

## 2020-12-30 ENCOUNTER — Other Ambulatory Visit: Payer: Self-pay

## 2020-12-30 ENCOUNTER — Ambulatory Visit (INDEPENDENT_AMBULATORY_CARE_PROVIDER_SITE_OTHER): Payer: Medicaid Other | Admitting: Obstetrics and Gynecology

## 2020-12-30 VITALS — BP 109/61 | HR 83 | Wt 173.0 lb

## 2020-12-30 DIAGNOSIS — Z3A32 32 weeks gestation of pregnancy: Secondary | ICD-10-CM

## 2020-12-30 DIAGNOSIS — Z3480 Encounter for supervision of other normal pregnancy, unspecified trimester: Secondary | ICD-10-CM

## 2020-12-30 DIAGNOSIS — E039 Hypothyroidism, unspecified: Secondary | ICD-10-CM

## 2020-12-30 MED ORDER — DOXYLAMINE-PYRIDOXINE 10-10 MG PO TBEC: 2.0000 | DELAYED_RELEASE_TABLET | Freq: Every day | ORAL | 0 refills | Status: DC

## 2020-12-30 MED ORDER — FAMOTIDINE 20 MG PO TABS: 20.0000 mg | ORAL_TABLET | Freq: Two times a day (BID) | ORAL | 1 refills | Status: DC

## 2020-12-30 NOTE — Progress Notes (Signed)
Pt noticing more blood when vomiting

## 2020-12-30 NOTE — Progress Notes (Signed)
   PRENATAL VISIT NOTE  Subjective:  Joan Jackson is a 25 y.o. G3P0020 at [redacted]w[redacted]d being seen today for ongoing prenatal care.  She is currently monitored for the following issues for this low-risk pregnancy and has Supervision of other normal pregnancy, antepartum and Hypothyroidism on their problem list.  Patient reports no complaints.  Contractions: Regular. Vag. Bleeding: None.  Movement: Present. Denies leaking of fluid.   The following portions of the patient's history were reviewed and updated as appropriate: allergies, current medications, past family history, past medical history, past social history, past surgical history and problem list.   Objective:   Vitals:   12/30/20 0938  BP: 109/61  Pulse: 83  Weight: 173 lb (78.5 kg)    Fetal Status: Fetal Heart Rate (bpm): 142 Fundal Height: 32 cm Movement: Present     General:  Alert, oriented and cooperative. Patient is in no acute distress.  Skin: Skin is warm and dry. No rash noted.   Cardiovascular: Normal heart rate noted  Respiratory: Normal respiratory effort, no problems with respiration noted  Abdomen: Soft, gravid, appropriate for gestational age.  Pain/Pressure: Present     Pelvic: Cervical exam deferred        Extremities: Normal range of motion.  Edema: None  Mental Status: Normal mood and affect. Normal behavior. Normal judgment and thought content.   Assessment and Plan:  Pregnancy: G3P0020 at [redacted]w[redacted]d  1. Supervision of other normal pregnancy, antepartum  - TSH level today. - Declines pap today, wants to do PP.   2. Hypothyroidism, unspecified type  She is alternating synthroid levels.  100 mcg every other day 75 mcg every other day.     Preterm labor symptoms and general obstetric precautions including but not limited to vaginal bleeding, contractions, leaking of fluid and fetal movement were reviewed in detail with the patient. Please refer to After Visit Summary for other counseling recommendations.    Return in about 2 weeks (around 01/13/2021).  Future Appointments  Date Time Provider Department Center  01/13/2021  9:30 AM [facility] NURSE [facility]  01/13/2021  9:45 AM [facility] US5 [facility]    Venia Carbon, NP

## 2020-12-31 LAB — TSH: TSH: 2.37 mIU/L

## 2021-01-13 ENCOUNTER — Ambulatory Visit (HOSPITAL_BASED_OUTPATIENT_CLINIC_OR_DEPARTMENT_OTHER): Payer: Medicaid Other | Admitting: Obstetrics and Gynecology

## 2021-01-13 ENCOUNTER — Ambulatory Visit: Payer: Medicaid Other | Admitting: *Deleted

## 2021-01-13 ENCOUNTER — Other Ambulatory Visit: Payer: Self-pay

## 2021-01-13 ENCOUNTER — Other Ambulatory Visit: Payer: Self-pay | Admitting: *Deleted

## 2021-01-13 ENCOUNTER — Ambulatory Visit: Payer: Medicaid Other | Attending: Obstetrics and Gynecology

## 2021-01-13 ENCOUNTER — Telehealth: Payer: Self-pay | Admitting: *Deleted

## 2021-01-13 ENCOUNTER — Encounter: Payer: Self-pay | Admitting: *Deleted

## 2021-01-13 VITALS — BP 123/57 | HR 88

## 2021-01-13 DIAGNOSIS — Z3A34 34 weeks gestation of pregnancy: Secondary | ICD-10-CM | POA: Insufficient documentation

## 2021-01-13 DIAGNOSIS — O99280 Endocrine, nutritional and metabolic diseases complicating pregnancy, unspecified trimester: Secondary | ICD-10-CM | POA: Diagnosis present

## 2021-01-13 DIAGNOSIS — O3500X0 Maternal care for (suspected) central nervous system malformation or damage in fetus, unspecified, not applicable or unspecified: Secondary | ICD-10-CM

## 2021-01-13 DIAGNOSIS — O99283 Endocrine, nutritional and metabolic diseases complicating pregnancy, third trimester: Secondary | ICD-10-CM | POA: Diagnosis present

## 2021-01-13 DIAGNOSIS — E039 Hypothyroidism, unspecified: Secondary | ICD-10-CM

## 2021-01-13 DIAGNOSIS — O350XX0 Maternal care for (suspected) central nervous system malformation in fetus, not applicable or unspecified: Secondary | ICD-10-CM

## 2021-01-13 NOTE — Telephone Encounter (Signed)
Left patient a message to call if she is able to move her appointment to 8:45 or 10:30 AM on 01/14/2021 as of this time today. This is subject to change as patient's call to schedule or are moved up from overbooking.

## 2021-01-13 NOTE — Progress Notes (Signed)
Maternal-Fetal Medicine   Name: Rosanne Wohlfarth DOB: 1995-08-26 MRN: [id number] Referring Provider: Elsie Lincoln, MD  Ms. Gist returned for fetal growth assessment.  She has hypothyroidism and takes levothyroxine supplements.  Her most recent TSH levels are within normal range.  Patient reports good fetal movements. On cell free fetal DNA screening, the risks of fetal aneuploidies are not increased.  Patient does not have gestational diabetes. Blood pressure today at her office is 123/57 mmHg. On today's ultrasound, amniotic fluid is normal and good fetal activity seen.  Fetal growth is appropriate for gestational age.  Unilateral (left) ventriculomegaly, measuring 10 mm, is seen.  Rest of the intracranial structures appear normal.  CSP and midline appears normal.  Mild isolated ventriculomegaly -I reassured the patient that mild isolated ventriculomegaly is associated with good neurodevelopmental outcomes in the newborn. -It can be associated with fetal Down syndrome or genetic conditions.  However, she had low risk for Down syndrome on fetal aneuploidy screening. -I am not recommending TORCH screening because of low likelihood of infection. Recommendations: -An appointment was made for her to return in 3 weeks for fetal growth assessment and evaluation of lateral ventricles. -Neonatal head ultrasound if ventriculomegaly persists on follow-up ultrasound.  Thank you for consultation.  If you have any questions or concerns, please contact me the Center for maternal-fetal care.  Consultation including face-to-face counseling 15 minutes.

## 2021-01-14 ENCOUNTER — Encounter: Payer: Self-pay | Admitting: Obstetrics and Gynecology

## 2021-01-14 ENCOUNTER — Ambulatory Visit (INDEPENDENT_AMBULATORY_CARE_PROVIDER_SITE_OTHER): Payer: Medicaid Other | Admitting: Obstetrics and Gynecology

## 2021-01-14 VITALS — BP 115/60 | HR 72 | Wt 174.0 lb

## 2021-01-14 DIAGNOSIS — Z3480 Encounter for supervision of other normal pregnancy, unspecified trimester: Secondary | ICD-10-CM

## 2021-01-14 DIAGNOSIS — E039 Hypothyroidism, unspecified: Secondary | ICD-10-CM

## 2021-01-14 NOTE — Progress Notes (Signed)
   PRENATAL VISIT NOTE  Subjective:  Joan Jackson is a 25 y.o. G3P0020 at [redacted]w[redacted]d being seen today for ongoing prenatal care.  She is currently monitored for the following issues for this low-risk pregnancy and has Supervision of other normal pregnancy, antepartum and Hypothyroidism on their problem list.  Patient reports no complaints.  Contractions: Not present. Vag. Bleeding: None.  Movement: Present. Denies leaking of fluid.   The following portions of the patient's history were reviewed and updated as appropriate: allergies, current medications, past family history, past medical history, past social history, past surgical history and problem list.   Objective:   Vitals:   01/14/21 1054  BP: 115/60  Pulse: 72  Weight: 174 lb (78.9 kg)    Fetal Status: Fetal Heart Rate (bpm): 145 Fundal Height: 34 cm Movement: Present     General:  Alert, oriented and cooperative. Patient is in no acute distress.  Skin: Skin is warm and dry. No rash noted.   Cardiovascular: Normal heart rate noted  Respiratory: Normal respiratory effort, no problems with respiration noted  Abdomen: Soft, gravid, appropriate for gestational age.  Pain/Pressure: Present     Pelvic: Cervical exam deferred        Extremities: Normal range of motion.  Edema: None  Mental Status: Normal mood and affect. Normal behavior. Normal judgment and thought content.   Assessment and Plan:  Pregnancy: G3P0020 at [redacted]w[redacted]d 1. Supervision of other normal pregnancy, antepartum Patient is doing well without complaints Ventriculomegaly noted on 7/6 ultrasound- follow up ultrasound ordered GBS and cultures next visit Pap smear postpartum per patient request Answered questions regarding pain options  2. Hypothyroidism, unspecified type Normal TSH on 6/22 Continue current dosage  Preterm labor symptoms and general obstetric precautions including but not limited to vaginal bleeding, contractions, leaking of fluid and fetal movement were  reviewed in detail with the patient. Please refer to After Visit Summary for other counseling recommendations.   Return in about 2 weeks (around 01/28/2021) for in person, ROB.  Future Appointments  Date Time Provider Department Center  02/03/2021  3:30 PM [facility] NURSE [facility]  02/03/2021  3:30 PM [facility] US3 [facility]    Catalina Antigua, MD

## 2021-01-27 ENCOUNTER — Other Ambulatory Visit (HOSPITAL_COMMUNITY)
Admission: RE | Admit: 2021-01-27 | Discharge: 2021-01-27 | Disposition: A | Discharge status: Home / Self Care | Payer: Medicaid Other | Source: Ambulatory Visit | Attending: Obstetrics and Gynecology | Admitting: Obstetrics and Gynecology

## 2021-01-27 ENCOUNTER — Other Ambulatory Visit: Payer: Self-pay

## 2021-01-27 ENCOUNTER — Ambulatory Visit (INDEPENDENT_AMBULATORY_CARE_PROVIDER_SITE_OTHER): Payer: Medicaid Other | Admitting: Obstetrics and Gynecology

## 2021-01-27 VITALS — BP 113/67 | HR 77 | Wt 177.0 lb

## 2021-01-27 DIAGNOSIS — Z3A36 36 weeks gestation of pregnancy: Secondary | ICD-10-CM

## 2021-01-27 DIAGNOSIS — Z124 Encounter for screening for malignant neoplasm of cervix: Secondary | ICD-10-CM

## 2021-01-27 DIAGNOSIS — Z3480 Encounter for supervision of other normal pregnancy, unspecified trimester: Secondary | ICD-10-CM

## 2021-01-27 LAB — OB RESULTS CONSOLE GBS: GBS: NEGATIVE

## 2021-01-27 NOTE — Progress Notes (Signed)
   PRENATAL VISIT NOTE  Subjective:  Joan Jackson is a 25 y.o. G3P0020 at [redacted]w[redacted]d being seen today for ongoing prenatal care.  She is currently monitored for the following issues for this low-risk pregnancy and has Supervision of other normal pregnancy, antepartum and Hypothyroidism on their problem list.  Patient reports no complaints.  Contractions: Not present. Vag. Bleeding: None.  Movement: Present. Denies leaking of fluid.   The following portions of the patient's history were reviewed and updated as appropriate: allergies, current medications, past family history, past medical history, past social history, past surgical history and problem list.   Objective:   Vitals:   01/27/21 1131  BP: 113/67  Pulse: 77  Weight: 177 lb (80.3 kg)    Fetal Status: Fetal Heart Rate (bpm): 135 Fundal Height: 37 cm Movement: Present     General:  Alert, oriented and cooperative. Patient is in no acute distress.  Skin: Skin is warm and dry. No rash noted.   Cardiovascular: Normal heart rate noted  Respiratory: Normal respiratory effort, no problems with respiration noted  Abdomen: Soft, gravid, appropriate for gestational age.  Pain/Pressure: Present     Pelvic: Cervical exam performed in the presence of a chaperone     speculum exam completed: pap collected, patient tolerated well.   Extremities: Normal range of motion.  Edema: None  Mental Status: Normal mood and affect. Normal behavior. Normal judgment and thought content.   Assessment and Plan:  Pregnancy: G3P0020 at [redacted]w[redacted]d 1. Supervision of other normal pregnancy, antepartum  - Culture, beta strep (group b only)  2. Screening for cervical cancer  - Cytology - PAP( [place])  Preterm labor symptoms and general obstetric precautions including but not limited to vaginal bleeding, contractions, leaking of fluid and fetal movement were reviewed in detail with the patient. Please refer to After Visit Summary for other counseling  recommendations.   Return in about 1 week (around 02/03/2021).  Future Appointments  Date Time Provider Department Center  02/02/2021  9:30 AM Simra Fiebig, Harolyn Rutherford, NP [facility]  02/03/2021  3:30 PM [facility] NURSE [facility]  02/03/2021  3:30 PM [facility] US3 [facility]    Venia Carbon, NP

## 2021-01-27 NOTE — Patient Instructions (Signed)

## 2021-01-28 LAB — CYTOLOGY - PAP
Adequacy: ABSENT
Chlamydia: NEGATIVE
Comment: NEGATIVE
Comment: NORMAL
Diagnosis: NEGATIVE
Neisseria Gonorrhea: NEGATIVE

## 2021-01-30 LAB — CULTURE, BETA STREP (GROUP B ONLY)
MICRO NUMBER:: 12143694
SPECIMEN QUALITY:: ADEQUATE

## 2021-02-02 ENCOUNTER — Ambulatory Visit (INDEPENDENT_AMBULATORY_CARE_PROVIDER_SITE_OTHER): Payer: Medicaid Other | Admitting: Obstetrics and Gynecology

## 2021-02-02 ENCOUNTER — Other Ambulatory Visit: Payer: Self-pay

## 2021-02-02 DIAGNOSIS — Z3480 Encounter for supervision of other normal pregnancy, unspecified trimester: Secondary | ICD-10-CM

## 2021-02-02 NOTE — Progress Notes (Signed)
   PRENATAL VISIT NOTE  Subjective:  Joan Jackson is a 25 y.o. G3P0020 at [redacted]w[redacted]d being seen today for ongoing prenatal care.  She is currently monitored for the following issues for this low-risk pregnancy and has Supervision of other normal pregnancy, antepartum and Hypothyroidism on their problem list.  Patient reports no complaints.  Contractions: Not present. Vag. Bleeding: None.  Movement: Present. Denies leaking of fluid.   The following portions of the patient's history were reviewed and updated as appropriate: allergies, current medications, past family history, past medical history, past social history, past surgical history and problem list.   Objective:   Vitals:   02/02/21 0924  BP: 115/66  Pulse: 87  Weight: 179 lb (81.2 kg)    Fetal Status: Fetal Heart Rate (bpm): 136 Fundal Height: 37 cm Movement: Present     General:  Alert, oriented and cooperative. Patient is in no acute distress.  Skin: Skin is warm and dry. No rash noted.   Cardiovascular: Normal heart rate noted  Respiratory: Normal respiratory effort, no problems with respiration noted  Abdomen: Soft, gravid, appropriate for gestational age.  Pain/Pressure: Present     Pelvic: Cervical exam deferred        Extremities: Normal range of motion.  Edema: Trace  Mental Status: Normal mood and affect. Normal behavior. Normal judgment and thought content.   Assessment and Plan:  Pregnancy: G3P0020 at [redacted]w[redacted]d  1. Supervision of other normal pregnancy, antepartum  GBS negative   Preterm labor symptoms and general obstetric precautions including but not limited to vaginal bleeding, contractions, leaking of fluid and fetal movement were reviewed in detail with the patient. Please refer to After Visit Summary for other counseling recommendations.   No follow-ups on file.  Future Appointments  Date Time Provider Department Center  02/03/2021  3:30 PM [facility] NURSE [facility]  02/03/2021  3:30 PM [facility] US3 [facility]  02/12/2021  9:10 AM Gerrit Heck, CNM [facility]    Venia Carbon, NP

## 2021-02-03 ENCOUNTER — Ambulatory Visit: Payer: Medicaid Other | Admitting: *Deleted

## 2021-02-03 ENCOUNTER — Encounter: Payer: Self-pay | Admitting: *Deleted

## 2021-02-03 ENCOUNTER — Ambulatory Visit: Payer: Medicaid Other | Attending: Obstetrics and Gynecology

## 2021-02-03 VITALS — BP 121/62 | HR 86

## 2021-02-03 DIAGNOSIS — Z3A37 37 weeks gestation of pregnancy: Secondary | ICD-10-CM | POA: Diagnosis not present

## 2021-02-03 DIAGNOSIS — O99280 Endocrine, nutritional and metabolic diseases complicating pregnancy, unspecified trimester: Secondary | ICD-10-CM | POA: Insufficient documentation

## 2021-02-03 DIAGNOSIS — O99283 Endocrine, nutritional and metabolic diseases complicating pregnancy, third trimester: Secondary | ICD-10-CM | POA: Diagnosis not present

## 2021-02-03 DIAGNOSIS — E039 Hypothyroidism, unspecified: Secondary | ICD-10-CM | POA: Diagnosis present

## 2021-02-03 DIAGNOSIS — O350XX0 Maternal care for (suspected) central nervous system malformation in fetus, not applicable or unspecified: Secondary | ICD-10-CM | POA: Diagnosis not present

## 2021-02-12 ENCOUNTER — Ambulatory Visit (INDEPENDENT_AMBULATORY_CARE_PROVIDER_SITE_OTHER): Payer: Medicaid Other

## 2021-02-12 ENCOUNTER — Other Ambulatory Visit: Payer: Self-pay

## 2021-02-12 VITALS — BP 124/75 | HR 86 | Wt 177.0 lb

## 2021-02-12 DIAGNOSIS — Z3A38 38 weeks gestation of pregnancy: Secondary | ICD-10-CM

## 2021-02-12 DIAGNOSIS — E039 Hypothyroidism, unspecified: Secondary | ICD-10-CM

## 2021-02-12 DIAGNOSIS — Z3480 Encounter for supervision of other normal pregnancy, unspecified trimester: Secondary | ICD-10-CM

## 2021-02-12 NOTE — Progress Notes (Signed)
   LOW-RISK PREGNANCY OFFICE VISIT  Patient name: Joan Jackson MRN [id number]  Date of birth: 1995-12-18 Chief Complaint:   Routine Prenatal Visit  Subjective:   Joan Jackson is a 25 y.o. G67P0020 female at [redacted]w[redacted]d with an Estimated Date of Delivery: 02/22/21 being seen today for ongoing management of a low-risk pregnancy aeb has Supervision of other normal pregnancy, antepartum and Hypothyroidism on their problem list.  Patient presents today with backache and pelvic pressure .  Patient endorses fetal movement. Patient denies abdominal cramping or contractions. She reports some "downward pushing of the baby."  Patient denies vaginal concerns including abnormal discharge, leaking of fluid, and bleeding.  Contractions: Irritability. Vag. Bleeding: None.  Movement: Present.  Reviewed past medical,surgical, social, obstetrical and family history as well as problem list, medications and allergies.  Objective   Vitals:   02/12/21 0935  BP: 124/75  Pulse: 86  Weight: 177 lb (80.3 kg)  Body mass index is 28.57 kg/m.  Total Weight Gain:17 lb (7.711 kg)         Physical Examination:   General appearance: Well appearing, and in no distress  Mental status: Alert, oriented to person, place, and time  Skin: Warm & dry  Cardiovascular: Normal heart rate noted  Respiratory: Normal respiratory effort, no distress  Abdomen: Soft, gravid, nontender, AGA with Fundal Height: 39 cm Pelvic: Cervical exam performed  Dilation: Closed Effacement (%): Thick Station: Ballotable  External OS 2cm, but internal closed.   Extremities:    Fetal Status: Fetal Heart Rate (bpm): 143  Movement: Present   No results found for this or any previous visit (from the past 24 hour(s)).  Assessment & Plan:  Low-risk pregnancy of a 25 y.o., G3P0020 at [redacted]w[redacted]d with an Estimated Date of Delivery: 02/22/21   1. Supervision of other normal pregnancy, antepartum -Anticipatory guidance for upcoming appts. -Patient to schedule next  appt in 1 weeks for an in-person visit. -Reviewed exam findings.  2. [redacted] weeks gestation of pregnancy -Doing well overall. -Informed that pelvic pain and pressure is normal in late pregnancy. -Briefly discussed IOL at 41 weeks for Postdates. -Informed that next appt will schedule for IOL.   3. Hypothyroidism, unspecified type -Taking medication as prescribed.    Meds: No orders of the defined types were placed in this encounter.  Labs/procedures today:  Lab Orders  No laboratory test(s) ordered today     Reviewed: Term labor symptoms and general obstetric precautions including but not limited to vaginal bleeding, contractions, leaking of fluid and fetal movement were reviewed in detail with the patient.  All questions were answered.  Follow-up: Return in about 1 week (around 02/19/2021) for LROB.  No orders of the defined types were placed in this encounter.  Cherre Robins MSN, CNM 02/12/2021

## 2021-02-19 ENCOUNTER — Ambulatory Visit (INDEPENDENT_AMBULATORY_CARE_PROVIDER_SITE_OTHER): Payer: Medicaid Other | Admitting: Certified Nurse Midwife

## 2021-02-19 ENCOUNTER — Other Ambulatory Visit: Payer: Self-pay

## 2021-02-19 VITALS — BP 137/82 | HR 92 | Wt 178.0 lb

## 2021-02-19 DIAGNOSIS — Z3A39 39 weeks gestation of pregnancy: Secondary | ICD-10-CM

## 2021-02-19 DIAGNOSIS — Z3480 Encounter for supervision of other normal pregnancy, unspecified trimester: Secondary | ICD-10-CM

## 2021-02-19 NOTE — Progress Notes (Signed)
Subjective:  Joan Jackson is a 25 y.o. G3P0020 at [redacted]w[redacted]d being seen today for ongoing prenatal care.  She is currently monitored for the following issues for this low-risk pregnancy and has Supervision of other normal pregnancy, antepartum and Hypothyroidism on their problem list.  Patient reports no complaints.  Contractions: Irritability. Vag. Bleeding: None.  Movement: Present. Denies leaking of fluid.   The following portions of the patient's history were reviewed and updated as appropriate: allergies, current medications, past family history, past medical history, past social history, past surgical history and problem list. Problem list updated.  Objective:   Vitals:   02/19/21 0947  BP: 137/82  Pulse: 92  Weight: 178 lb (80.7 kg)    Fetal Status: Fetal Heart Rate (bpm): 147 Fundal Height: 37 cm Movement: Present  Presentation: Vertex  General:  Alert, oriented and cooperative. Patient is in no acute distress.  Skin: Skin is warm and dry. No rash noted.   Cardiovascular: Normal heart rate noted  Respiratory: Normal respiratory effort, no problems with respiration noted  Abdomen: Soft, gravid, appropriate for gestational age. Pain/Pressure: Present     Pelvic: Vag. Bleeding: None Vag D/C Character: Thin   Cervical exam deferred        Extremities: Normal range of motion.  Edema: Trace  Mental Status: Normal mood and affect. Normal behavior. Normal judgment and thought content.   Urinalysis:      Assessment and Plan:  Pregnancy: G3P0020 at [redacted]w[redacted]d  1. Supervision of other normal pregnancy, antepartum  2. [redacted] weeks gestation of pregnancy - watch BP, PEC discussed - NST/AF next week - IOL at 41 wks  Term labor symptoms and general obstetric precautions including but not limited to vaginal bleeding, contractions, leaking of fluid and fetal movement were reviewed in detail with the patient. Please refer to After Visit Summary for other counseling recommendations.  Return in about 1  week (around 02/26/2021).   Donette Larry, CNM

## 2021-02-26 ENCOUNTER — Ambulatory Visit (INDEPENDENT_AMBULATORY_CARE_PROVIDER_SITE_OTHER): Payer: Medicaid Other | Admitting: Obstetrics and Gynecology

## 2021-02-26 ENCOUNTER — Other Ambulatory Visit: Payer: Self-pay

## 2021-02-26 ENCOUNTER — Telehealth: Payer: Self-pay

## 2021-02-26 VITALS — BP 126/73 | HR 95 | Wt 179.0 lb

## 2021-02-26 DIAGNOSIS — Z3480 Encounter for supervision of other normal pregnancy, unspecified trimester: Secondary | ICD-10-CM

## 2021-02-26 DIAGNOSIS — Z3A40 40 weeks gestation of pregnancy: Secondary | ICD-10-CM | POA: Diagnosis not present

## 2021-02-26 NOTE — Telephone Encounter (Addendum)
I called [employer] to finalize pt's induction and the secretary is out sick today. I spoke to the charge nurse Neysa Bonito)  who states 8/20, 8/21 and 8/22 and the rest of next week is already full of inductions. Charge nurse was able to schedule pt for induction but states pt may not get a call on scheduled date and they will get her in as soon as possible. Pt and provider are aware.

## 2021-02-26 NOTE — Progress Notes (Signed)
   PRENATAL VISIT NOTE  Subjective:  Joan Jackson is a 25 y.o. G3P0020 at [redacted]w[redacted]d being seen today for ongoing prenatal care.  She is currently monitored for the following issues for this low-risk pregnancy and has Supervision of other normal pregnancy, antepartum and Hypothyroidism on their problem list.  Patient reports no complaints.  Contractions: Irritability. Vag. Bleeding: None.  Movement: Present. Denies leaking of fluid.   The following portions of the patient's history were reviewed and updated as appropriate: allergies, current medications, past family history, past medical history, past social history, past surgical history and problem list.   Objective:   Vitals:   02/26/21 0838  BP: 126/73  Pulse: 95  Weight: 179 lb (81.2 kg)    Fetal Status:     Movement: Present     General:  Alert, oriented and cooperative. Patient is in no acute distress.  Skin: Skin is warm and dry. No rash noted.   Cardiovascular: Normal heart rate noted  Respiratory: Normal respiratory effort, no problems with respiration noted  Abdomen: Soft, gravid, appropriate for gestational age.  Pain/Pressure: Present     Pelvic: Cervical exam deferred        Extremities: Normal range of motion.  Edema: Trace  Mental Status: Normal mood and affect. Normal behavior. Normal judgment and thought content.   Assessment and Plan:  Pregnancy: G3P0020 at [redacted]w[redacted]d  1. Supervision of other normal pregnancy, antepartum  Offered induction of labor today or this weekend, she prefers to wait until closer to 41w No AFI available this week with MFM.  NST today reactive,  Baseline 135 bpm, moderate variability, no decels, 15x15 accels.  Reviewed patient with Dr. Para March. Induction scheduled for Monday AM. Orders placed Labor precautions reviewed     Preterm labor symptoms and general obstetric precautions including but not limited to vaginal bleeding, contractions, leaking of fluid and fetal movement were reviewed in  detail with the patient. Please refer to After Visit Summary for other counseling recommendations.   No follow-ups on file.  Future Appointments  Date Time Provider Department Center  03/31/2021  9:10 AM Bethanne Mule, Harolyn Rutherford, NP [facility]    Venia Carbon, NP

## 2021-02-27 ENCOUNTER — Other Ambulatory Visit: Payer: Self-pay | Admitting: Advanced Practice Midwife

## 2021-02-28 ENCOUNTER — Encounter (HOSPITAL_COMMUNITY): Payer: Self-pay | Admitting: Obstetrics and Gynecology

## 2021-02-28 ENCOUNTER — Inpatient Hospital Stay (HOSPITAL_COMMUNITY)
Admission: AD | Admit: 2021-02-28 | Discharge: 2021-03-03 | DRG: 805 | Disposition: A | Discharge status: Home / Self Care | Payer: Medicaid Other | Attending: Family Medicine | Admitting: Family Medicine

## 2021-02-28 ENCOUNTER — Other Ambulatory Visit: Payer: Self-pay

## 2021-02-28 ENCOUNTER — Inpatient Hospital Stay (HOSPITAL_COMMUNITY): Payer: Medicaid Other

## 2021-02-28 DIAGNOSIS — Z3A40 40 weeks gestation of pregnancy: Secondary | ICD-10-CM | POA: Diagnosis not present

## 2021-02-28 DIAGNOSIS — O99284 Endocrine, nutritional and metabolic diseases complicating childbirth: Secondary | ICD-10-CM | POA: Diagnosis not present

## 2021-02-28 DIAGNOSIS — E039 Hypothyroidism, unspecified: Secondary | ICD-10-CM | POA: Diagnosis present

## 2021-02-28 DIAGNOSIS — O350XX0 Maternal care for (suspected) central nervous system malformation in fetus, not applicable or unspecified: Secondary | ICD-10-CM | POA: Diagnosis present

## 2021-02-28 DIAGNOSIS — Z3480 Encounter for supervision of other normal pregnancy, unspecified trimester: Secondary | ICD-10-CM

## 2021-02-28 DIAGNOSIS — O411230 Chorioamnionitis, third trimester, not applicable or unspecified: Secondary | ICD-10-CM | POA: Diagnosis present

## 2021-02-28 DIAGNOSIS — Z3A41 41 weeks gestation of pregnancy: Secondary | ICD-10-CM | POA: Diagnosis not present

## 2021-02-28 DIAGNOSIS — O480 Post-term pregnancy: Principal | ICD-10-CM | POA: Diagnosis present

## 2021-02-28 LAB — CBC
HCT: 37.6 % (ref 36.0–46.0)
Hemoglobin: 12.8 g/dL (ref 12.0–15.0)
MCH: 28.1 pg (ref 26.0–34.0)
MCHC: 34 g/dL (ref 30.0–36.0)
MCV: 82.6 fL (ref 80.0–100.0)
Platelets: 257 10*3/uL (ref 150–400)
RBC: 4.55 MIL/uL (ref 3.87–5.11)
RDW: 13.5 % (ref 11.5–15.5)
WBC: 10.5 10*3/uL (ref 4.0–10.5)
nRBC: 0 % (ref 0.0–0.2)

## 2021-02-28 LAB — TYPE AND SCREEN
ABO/RH(D): A POS
Antibody Screen: NEGATIVE

## 2021-02-28 MED ORDER — FENTANYL CITRATE (PF) 100 MCG/2ML IJ SOLN
50.0000 ug | INTRAMUSCULAR | Status: DC | PRN
  Administered 2021-02-28 (×2): 50 ug via INTRAVENOUS
  Filled 2021-02-28 (×2): qty 2

## 2021-02-28 MED ORDER — ONDANSETRON HCL 4 MG/2ML IJ SOLN
4.0000 mg | Freq: Four times a day (QID) | INTRAMUSCULAR | Status: DC | PRN
Start: 2021-02-28 — End: 2021-03-01

## 2021-02-28 MED ORDER — EPHEDRINE 5 MG/ML INJ: 10.0000 mg | INTRAVENOUS | Status: DC | PRN

## 2021-02-28 MED ORDER — LIDOCAINE HCL (PF) 1 % IJ SOLN
30.0000 mL | INTRAMUSCULAR | Status: DC | PRN
Start: 2021-02-28 — End: 2021-03-01

## 2021-02-28 MED ORDER — OXYTOCIN-SODIUM CHLORIDE 30-0.9 UT/500ML-% IV SOLN
1.0000 m[IU]/min | INTRAVENOUS | Status: DC
  Administered 2021-02-28: 2 m[IU]/min via INTRAVENOUS
  Filled 2021-02-28: qty 500

## 2021-02-28 MED ORDER — OXYTOCIN BOLUS FROM INFUSION: 333.0000 mL | Freq: Once | INTRAVENOUS | Status: DC

## 2021-02-28 MED ORDER — MISOPROSTOL 50MCG HALF TABLET
50.0000 ug | ORAL_TABLET | ORAL | Status: DC
  Administered 2021-02-28: 50 ug via BUCCAL
  Filled 2021-02-28: qty 1

## 2021-02-28 MED ORDER — DIPHENHYDRAMINE HCL 50 MG/ML IJ SOLN
12.5000 mg | INTRAMUSCULAR | Status: DC | PRN
Start: 2021-02-28 — End: 2021-03-01

## 2021-02-28 MED ORDER — ACETAMINOPHEN 325 MG PO TABS: 650.0000 mg | ORAL_TABLET | ORAL | Status: DC | PRN

## 2021-02-28 MED ORDER — OXYCODONE-ACETAMINOPHEN 5-325 MG PO TABS: 2.0000 | ORAL_TABLET | ORAL | Status: DC | PRN

## 2021-02-28 MED ORDER — PHENYLEPHRINE 40 MCG/ML (10ML) SYRINGE FOR IV PUSH (FOR BLOOD PRESSURE SUPPORT)
80.0000 ug | PREFILLED_SYRINGE | INTRAVENOUS | Status: DC | PRN
  Filled 2021-02-28: qty 10

## 2021-02-28 MED ORDER — SOD CITRATE-CITRIC ACID 500-334 MG/5ML PO SOLN: 30.0000 mL | ORAL | Status: DC | PRN

## 2021-02-28 MED ORDER — FENTANYL CITRATE (PF) 100 MCG/2ML IJ SOLN
100.0000 ug | INTRAMUSCULAR | Status: DC | PRN
Start: 2021-02-28 — End: 2021-03-01
  Administered 2021-02-28 – 2021-03-01 (×4): 100 ug via INTRAVENOUS
  Filled 2021-02-28 (×4): qty 2

## 2021-02-28 MED ORDER — FENTANYL-BUPIVACAINE-NACL 0.5-0.125-0.9 MG/250ML-% EP SOLN
12.0000 mL/h | EPIDURAL | Status: DC | PRN
  Administered 2021-03-01: 12 mL/h via EPIDURAL
  Filled 2021-02-28: qty 250

## 2021-02-28 MED ORDER — LACTATED RINGERS IV SOLN
500.0000 mL | INTRAVENOUS | Status: DC | PRN
Start: 2021-02-28 — End: 2021-03-01
  Administered 2021-02-28: 500 mL via INTRAVENOUS

## 2021-02-28 MED ORDER — OXYCODONE-ACETAMINOPHEN 5-325 MG PO TABS: 1.0000 | ORAL_TABLET | ORAL | Status: DC | PRN

## 2021-02-28 MED ORDER — TERBUTALINE SULFATE 1 MG/ML IJ SOLN: 0.2500 mg | Freq: Once | INTRAMUSCULAR | Status: DC | PRN

## 2021-02-28 MED ORDER — PHENYLEPHRINE 40 MCG/ML (10ML) SYRINGE FOR IV PUSH (FOR BLOOD PRESSURE SUPPORT)
80.0000 ug | PREFILLED_SYRINGE | INTRAVENOUS | Status: DC | PRN
  Administered 2021-03-01 (×2): 80 ug via INTRAVENOUS

## 2021-02-28 MED ORDER — LACTATED RINGERS IV SOLN: INTRAVENOUS | Status: DC

## 2021-02-28 MED ORDER — LACTATED RINGERS IV SOLN
500.0000 mL | Freq: Once | INTRAVENOUS | Status: AC
  Administered 2021-03-01: 500 mL via INTRAVENOUS

## 2021-02-28 NOTE — Progress Notes (Signed)
CNM called to bedside for vaginal bleeding. RN reports large amount of bleeding with removal of FB. After cleaning up and changes pads, bleeding no longer noted. Cervix 4/60/-2 and FHR Category 1. Will monitor bleeding and FHR.   Rolm Bookbinder, CNM 02/28/21 9:33 PM

## 2021-02-28 NOTE — Progress Notes (Signed)
Labor Progress Note Joan Jackson is a 25 y.o. G3P0020 at [redacted]w[redacted]d presented for IOL for postdates  S:  Feels comfortable after IV pain medication, now reporting cramping  O:  BP 109/68   Pulse 83   Temp 97.8 F (36.6 C) (Oral)   Resp 19   Ht 5' 6.5" (1.689 m)   Wt 81.9 kg   LMP 05/18/2020   BMI 28.71 kg/m   Fetal Tracing:  Baseline: 130 Variability: moderate Accels: 15x15 Decels: none  Toco: 2-4   CVE: Dilation: 2 Effacement (%): 40 Station: -3 Presentation: Vertex Exam by:: Dr Annia Friendly   A&P: 25 y.o. G3P0020 [redacted]w[redacted]d IOL postdates #Labor: Progressing well. FB in place, will continue cytotec #Pain: IV fentanyl #FWB: Cat 1 #GBS negative   Rolm Bookbinder, CNM 8:30 PM

## 2021-02-28 NOTE — H&P (Signed)
OBSTETRIC ADMISSION HISTORY AND PHYSICAL  Joan Jackson is a 25 y.o. female G3P0020 with IUP at [redacted]w[redacted]d by LMP presenting for IOL due to post-dates. She reports +FMs, No LOF, no VB, no blurry vision, headaches or peripheral edema, and RUQ pain.  She plans on breast feeding. She undecided for birth control.  She received her prenatal care at  [facility].    Dating: By LMP --->  Estimated Date of Delivery: 02/22/21  Sono:    @[redacted]w[redacted]d , CWD, normal anatomy (23mm ventriculomegaly), cephalic presentation,  3181g, 59% EFW   Prenatal History/Complications:  --Hypothyroidism on synthroid well controlled --History of infertility with two miscarriages, used trigger shot/BCG via REI for conception --Fetal ventriculomegaly seen on U/S without other intracranial abnormalities   Past Medical History: Past Medical History:  Diagnosis Date   Hyperthyroidism     Past Surgical History: No past surgical history on file.  Obstetrical History: OB History     Gravida  3   Para  0   Term  0   Preterm  0   AB  2   Living  0      SAB  2   IAB  0   Ectopic  0   Multiple  0   Live Births              Social History Social History   Socioeconomic History   Marital status: Married    Spouse name: Not on file   Number of children: Not on file   Years of education: Not on file   Highest education level: Not on file  Occupational History   Occupation: Homemaker  Tobacco Use   Smoking status: Never   Smokeless tobacco: Never  Vaping Use   Vaping Use: Never used  Substance and Sexual Activity   Alcohol use: No    Alcohol/week: 0.0 standard drinks   Drug use: No   Sexual activity: Yes    Partners: Male    Birth control/protection: None  Other Topics Concern   Not on file  Social History Narrative   Not on file   Social Determinants of Health   Financial Resource Strain: Not on file  Food Insecurity: Not on file  Transportation Needs: Not on file  Physical Activity:  Not on file  Stress: Not on file  Social Connections: Not on file    Family History: Family History  Problem Relation Age of Onset   Diabetes Mother    Hypertension Mother    Anemia Mother    Diabetes Father    Anemia Sister     Allergies: Allergies  Allergen Reactions   Bee Venom Swelling    Medications Prior to Admission  Medication Sig Dispense Refill Last Dose   Doxylamine-Pyridoxine (DICLEGIS) 10-10 MG TBEC Take 2 tablets by mouth daily. Take 2 tablets at bedtime. If symptoms persist, add 1 tab in the AM starting on day 3. If symptoms persist, add 1 tab in the PM starting day 4. (Patient not taking: No sig reported) 100 tablet 0    famotidine (PEPCID) 20 MG tablet Take 1 tablet (20 mg total) by mouth 2 (two) times daily. 60 tablet 1    levothyroxine (SYNTHROID) 100 MCG tablet Pt to take 100 mcg every other day 15 tablet 2    levothyroxine (SYNTHROID) 75 MCG tablet Take 75 mcg by mouth daily before breakfast. Alternate every other day with 100 mcg      Prenat-Fe Poly-Methfol-FA-DHA (VITAFOL ULTRA) 29-0.6-0.4-200 MG CAPS Take 1 tablet  by mouth daily. 30 capsule 12      Review of Systems   All systems reviewed and negative except as stated in HPI  Blood pressure 134/74, pulse 97, temperature 97.8 F (36.6 C), temperature source Oral, resp. rate 20, height 5' 6.5" (1.689 m), weight 81.9 kg, last menstrual period 05/18/2020. General appearance: alert, cooperative, and no distress Lungs: clear to auscultation bilaterally Heart: regular rate and rhythm Abdomen: soft, non-tender; bowel sounds normal Pelvic: NEFG Extremities: Homans sign is negative, no sign of DVT DTR's +1  Presentation: cephalic Fetal monitoringBaseline: 150 bpm, Variability: Good {> 6 bpm), Accelerations: Reactive, and Decelerations: Absent Uterine activityNone     Prenatal labs: ABO, Rh: A/RH(D) POSITIVE/-- (01/20 1118) Antibody: NO ANTIBODIES DETECTED (01/20 1118) Rubella: 10.20 (01/20  1118) RPR: NON-REACTIVE (05/16 0940)  HBsAg: NON-REACTIVE (01/20 1118)  HIV: NON-REACTIVE (05/16 0940)  GBS:   Negative  1 hr Glucola negative  Genetic screening  normal  Anatomy US see above   Prenatal Transfer Tool  Maternal Diabetes: No Genetic Screening: Normal Maternal Ultrasounds/Referrals: Normal Fetal Ultrasounds or other Referrals:  None Maternal Substance Abuse:  No Significant Maternal Medications:  Meds include: Syntroid Significant Maternal Lab Results: Group B Strep negative  No results found for this or any previous visit (from the past 24 hour(s)).  Patient Active Problem List   Diagnosis Date Noted   Post-dates pregnancy 02/28/2021   Supervision of other normal pregnancy, antepartum 07/30/2020   Hypothyroidism 07/30/2020    Assessment/Plan:  Joan Jackson is a 25 y.o. G3P0020 at [redacted]w[redacted]d here for IOL due to post-dates.   #Labor: FB placed with patient consent and buccal cyotec given x1.  Tolerated well. Anticipate transition to pit once FB falls out.  #Pain: Undecided, considering epidural  #FWB: Cat 1 #ID: GBS negative  #MOF: breastfeeding  #MOC: Undecided  #Circ: N/A  #Hypothyroidism: Well controlled. Alternating synthroid 75 and every other day. Took synthroid prior to pregnancy.   #Fetal ventriculomegaly: Unilateral left and mild (23mm) without additional intracranial abnormalities. Seen on U/S 7/27 at 37 weeks. Should not interfere with vaginal delivery. Will need post-delivery infant follow up.   Allayne Stack, DO  02/28/2021, 3:53 PM

## 2021-03-01 ENCOUNTER — Inpatient Hospital Stay (HOSPITAL_COMMUNITY): Payer: Medicaid Other | Admitting: Anesthesiology

## 2021-03-01 ENCOUNTER — Inpatient Hospital Stay (HOSPITAL_COMMUNITY): Admission: AD | Admit: 2021-03-01 | Payer: Medicaid Other | Source: Home / Self Care | Admitting: Family Medicine

## 2021-03-01 ENCOUNTER — Inpatient Hospital Stay (HOSPITAL_COMMUNITY): Payer: Medicaid Other

## 2021-03-01 ENCOUNTER — Encounter (HOSPITAL_COMMUNITY): Payer: Self-pay | Admitting: *Deleted

## 2021-03-01 DIAGNOSIS — O480 Post-term pregnancy: Secondary | ICD-10-CM

## 2021-03-01 DIAGNOSIS — Z3A41 41 weeks gestation of pregnancy: Secondary | ICD-10-CM

## 2021-03-01 DIAGNOSIS — O411230 Chorioamnionitis, third trimester, not applicable or unspecified: Secondary | ICD-10-CM

## 2021-03-01 DIAGNOSIS — O99284 Endocrine, nutritional and metabolic diseases complicating childbirth: Secondary | ICD-10-CM

## 2021-03-01 LAB — RPR: RPR Ser Ql: NONREACTIVE

## 2021-03-01 MED ORDER — WITCH HAZEL-GLYCERIN EX PADS: 1.0000 "application " | MEDICATED_PAD | CUTANEOUS | Status: DC | PRN

## 2021-03-01 MED ORDER — GENTAMICIN SULFATE 40 MG/ML IJ SOLN
5.0000 mg/kg | Freq: Once | INTRAVENOUS | Status: AC
  Administered 2021-03-01: 410 mg via INTRAVENOUS
  Filled 2021-03-01: qty 10.25

## 2021-03-01 MED ORDER — DIPHENHYDRAMINE HCL 25 MG PO CAPS: 25.0000 mg | ORAL_CAPSULE | Freq: Four times a day (QID) | ORAL | Status: DC | PRN

## 2021-03-01 MED ORDER — LEVOTHYROXINE SODIUM 75 MCG PO TABS
75.0000 ug | ORAL_TABLET | ORAL | Status: DC
  Administered 2021-03-02: 75 ug via ORAL
  Filled 2021-03-01: qty 1

## 2021-03-01 MED ORDER — ONDANSETRON HCL 4 MG/2ML IJ SOLN: 4.0000 mg | INTRAMUSCULAR | Status: DC | PRN

## 2021-03-01 MED ORDER — LEVOTHYROXINE SODIUM 75 MCG PO TABS: 75.0000 ug | ORAL_TABLET | Freq: Every day | ORAL | Status: DC

## 2021-03-01 MED ORDER — ACETAMINOPHEN 500 MG PO TABS
1000.0000 mg | ORAL_TABLET | ORAL | Status: DC | PRN
  Administered 2021-03-01: 1000 mg via ORAL
  Filled 2021-03-01: qty 2

## 2021-03-01 MED ORDER — TRANEXAMIC ACID-NACL 1000-0.7 MG/100ML-% IV SOLN: 1000.0000 mg | INTRAVENOUS | Status: DC

## 2021-03-01 MED ORDER — IBUPROFEN 600 MG PO TABS
600.0000 mg | ORAL_TABLET | Freq: Four times a day (QID) | ORAL | Status: DC
  Administered 2021-03-01 – 2021-03-03 (×8): 600 mg via ORAL
  Filled 2021-03-01 (×8): qty 1

## 2021-03-01 MED ORDER — COCONUT OIL OIL: 1.0000 "application " | TOPICAL_OIL | Status: DC | PRN

## 2021-03-01 MED ORDER — MEDROXYPROGESTERONE ACETATE 150 MG/ML IM SUSP: 150.0000 mg | INTRAMUSCULAR | Status: DC | PRN

## 2021-03-01 MED ORDER — TETANUS-DIPHTH-ACELL PERTUSSIS 5-2.5-18.5 LF-MCG/0.5 IM SUSY: 0.5000 mL | PREFILLED_SYRINGE | Freq: Once | INTRAMUSCULAR | Status: DC

## 2021-03-01 MED ORDER — PRENATAL MULTIVITAMIN CH
1.0000 | ORAL_TABLET | Freq: Every day | ORAL | Status: DC
  Administered 2021-03-01 – 2021-03-02 (×2): 1 via ORAL
  Filled 2021-03-01 (×2): qty 1

## 2021-03-01 MED ORDER — LEVOTHYROXINE SODIUM 100 MCG PO TABS
100.0000 ug | ORAL_TABLET | ORAL | Status: DC
  Administered 2021-03-01 – 2021-03-03 (×2): 100 ug via ORAL
  Filled 2021-03-01 (×2): qty 1

## 2021-03-01 MED ORDER — LACTATED RINGERS IV BOLUS: 1000.0000 mL | Freq: Once | INTRAVENOUS | Status: DC

## 2021-03-01 MED ORDER — TERBUTALINE SULFATE 1 MG/ML IJ SOLN: 0.2500 mg | Freq: Once | INTRAMUSCULAR | Status: DC | PRN

## 2021-03-01 MED ORDER — BENZOCAINE-MENTHOL 20-0.5 % EX AERO: 1.0000 "application " | INHALATION_SPRAY | CUTANEOUS | Status: DC | PRN

## 2021-03-01 MED ORDER — MEASLES, MUMPS & RUBELLA VAC IJ SOLR: 0.5000 mL | Freq: Once | INTRAMUSCULAR | Status: DC

## 2021-03-01 MED ORDER — ONDANSETRON HCL 4 MG PO TABS: 4.0000 mg | ORAL_TABLET | ORAL | Status: DC | PRN

## 2021-03-01 MED ORDER — ACETAMINOPHEN 325 MG PO TABS: 650.0000 mg | ORAL_TABLET | ORAL | Status: DC | PRN

## 2021-03-01 MED ORDER — DIBUCAINE (PERIANAL) 1 % EX OINT: 1.0000 "application " | TOPICAL_OINTMENT | CUTANEOUS | Status: DC | PRN

## 2021-03-01 MED ORDER — SENNOSIDES-DOCUSATE SODIUM 8.6-50 MG PO TABS
2.0000 | ORAL_TABLET | Freq: Every day | ORAL | Status: DC
  Administered 2021-03-02: 2 via ORAL
  Filled 2021-03-01: qty 2

## 2021-03-01 MED ORDER — LIDOCAINE HCL (PF) 1 % IJ SOLN
INTRAMUSCULAR | Status: DC | PRN
  Administered 2021-03-01: 12 mL via EPIDURAL

## 2021-03-01 MED ORDER — SODIUM CHLORIDE 0.9 % IV SOLN
2.0000 g | Freq: Four times a day (QID) | INTRAVENOUS | Status: DC
  Administered 2021-03-01: 2 g via INTRAVENOUS
  Filled 2021-03-01: qty 2000

## 2021-03-01 MED ORDER — SIMETHICONE 80 MG PO CHEW: 80.0000 mg | CHEWABLE_TABLET | ORAL | Status: DC | PRN

## 2021-03-01 MED ORDER — OXYTOCIN-SODIUM CHLORIDE 30-0.9 UT/500ML-% IV SOLN: 1.0000 m[IU]/min | INTRAVENOUS | Status: DC

## 2021-03-01 NOTE — Anesthesia Preprocedure Evaluation (Signed)
Anesthesia Evaluation  Patient identified by MRN, date of birth, ID band Patient awake    Reviewed: Allergy & Precautions, H&P , Patient's Chart, lab work & pertinent test results  Airway Mallampati: I       Dental no notable dental hx.    Pulmonary neg pulmonary ROS,    Pulmonary exam normal        Cardiovascular negative cardio ROS Normal cardiovascular exam     Neuro/Psych negative neurological ROS  negative psych ROS   GI/Hepatic negative GI ROS, Neg liver ROS,   Endo/Other    Renal/GU negative Renal ROS  negative genitourinary   Musculoskeletal negative musculoskeletal ROS (+)   Abdominal Normal abdominal exam  (+)   Peds  Hematology negative hematology ROS (+)   Anesthesia Other Findings   Reproductive/Obstetrics (+) Pregnancy                             Anesthesia Physical Anesthesia Plan  ASA: 2  Anesthesia Plan: Epidural   Post-op Pain Management:    Induction:   PONV Risk Score and Plan:   Airway Management Planned:   Additional Equipment:   Intra-op Plan:   Post-operative Plan:   Informed Consent: I have reviewed the patients History and Physical, chart, labs and discussed the procedure including the risks, benefits and alternatives for the proposed anesthesia with the patient or authorized representative who has indicated his/her understanding and acceptance.       Plan Discussed with:   Anesthesia Plan Comments:         Anesthesia Quick Evaluation

## 2021-03-01 NOTE — Anesthesia Procedure Notes (Signed)
Epidural Patient location during procedure: OB Start time: 03/01/2021 1:35 AM End time: 03/01/2021 1:39 AM  Staffing Anesthesiologist: Leilani Able, MD Performed: anesthesiologist   Preanesthetic Checklist Completed: patient identified, IV checked, site marked, risks and benefits discussed, surgical consent, monitors and equipment checked, pre-op evaluation and timeout performed  Epidural Patient position: sitting Prep: DuraPrep and site prepped and draped Patient monitoring: continuous pulse ox and blood pressure Approach: midline Location: L3-L4 Injection technique: LOR air  Needle:  Needle type: Tuohy  Needle gauge: 17 G Needle length: 9 cm and 9 Needle insertion depth: 5 cm cm Catheter type: closed end flexible Catheter size: 19 Gauge Catheter at skin depth: 10 cm Test dose: negative and Other  Assessment Events: blood not aspirated, injection not painful, no injection resistance, no paresthesia and negative IV test  Additional Notes Reason for block:procedure for pain

## 2021-03-01 NOTE — Discharge Summary (Addendum)
Postpartum Discharge Summary    Patient Name: Joan Jackson DOB: [birth year] MRN: [id number]  Date of admission: 02/28/2021 Delivery date:03/01/2021  Delivering provider: Renard Matter  Date of discharge: 03/03/2021  Admitting diagnosis: Post-dates pregnancy [O48.0] Intrauterine pregnancy: [redacted]w[redacted]d    Secondary diagnosis:  Active Problems:   Supervision of other normal pregnancy, antepartum   Hypothyroidism   Post-dates pregnancy  Additional problems: Triple I    Discharge diagnosis: Term Pregnancy Delivered                                              Post partum procedures: None Augmentation: Pitocin, Cytotec, and IP Foley Complications: Intrauterine Inflammation or infection (Chorioamniotis)  Hospital course: Induction of Labor With Vaginal Delivery   25y.o. yo G3P0020 at 410w0das admitted to the hospital 02/28/2021 for induction of labor.  Indication for induction: Postdates.  Patient had an uncomplicated labor course as follows: She had one dose of cytotec and FB placed in the afternoon. She was started on pitocin and rapidly progressed to complete. She pushed 2.5 hours and developed Triple I treated with ampicillin and gentamycin. She delivered without difficulty.  Delivery Method:Vaginal, Spontaneous  Episiotomy: None  Lacerations:  Labial  Details of delivery can be found in separate delivery note.  Patient had a routine postpartum course. Patient is discharged home 03/03/21.  Newborn Data: Birth date:03/01/2021  Birth time:7:26 AM  Gender:Female  Living status:Living  Apgars:9 ,9  Weight:3220 g   Magnesium Sulfate received: No BMZ received: No Rhophylac:N/A MMR:N/A T-DaP:Given prenatally Flu: No Transfusion:No  Physical exam  Vitals:   03/02/21 0500 03/02/21 1221 03/02/21 2030 03/03/21 0500  BP: 108/63 118/70 123/73 107/63  Pulse: 78 77 87 73  Resp: '17 18 18 16  ' Temp: 98.4 F (36.9 C) 97.9 F (36.6 C) 98 F (36.7 C) 98.2 F (36.8 C)  TempSrc: Oral Oral Oral  Oral  SpO2:   100% 100%  Weight:      Height:       General: alert Lochia: appropriate Uterine Fundus: firm DVT Evaluation: No evidence of DVT seen on physical exam. Labs: Lab Results  Component Value Date   WBC 10.5 02/28/2021   HGB 12.8 02/28/2021   HCT 37.6 02/28/2021   MCV 82.6 02/28/2021   PLT 257 02/28/2021   No flowsheet data found. Edinburgh Score: Edinburgh Postnatal Depression Scale Screening Tool 03/01/2021  I have been able to laugh and see the funny side of things. (No Data)     After visit meds:  Allergies as of 03/03/2021       Reactions   Bee Venom Swelling        Medication List     TAKE these medications    acetaminophen 325 MG tablet Commonly known as: Tylenol Take 2 tablets (650 mg total) by mouth every 4 (four) hours as needed (for pain scale < 4).   Doxylamine-Pyridoxine 10-10 MG Tbec Commonly known as: Diclegis Take 2 tablets by mouth daily. Take 2 tablets at bedtime. If symptoms persist, add 1 tab in the AM starting on day 3. If symptoms persist, add 1 tab in the PM starting day 4.   famotidine 20 MG tablet Commonly known as: Pepcid Take 1 tablet (20 mg total) by mouth 2 (two) times daily. What changed:  when to take this reasons to take this  ibuprofen 600 MG tablet Commonly known as: ADVIL Take 1 tablet (600 mg total) by mouth every 6 (six) hours.   levothyroxine 75 MCG tablet Commonly known as: SYNTHROID Take 75 mcg by mouth See admin instructions. 30mg daily alternating with 1067m daily What changed: Another medication with the same name was removed. Continue taking this medication, and follow the directions you see here.   Vitafol Ultra 29-0.6-0.4-200 MG Caps Take 1 tablet by mouth daily.         Discharge home in stable condition Infant Feeding: Breast Infant Disposition:home with mother Discharge instruction: per After Visit Summary and Postpartum booklet. Activity: Advance as tolerated. Pelvic rest for 6  weeks.  Diet: routine diet Future Appointments: Future Appointments  Date Time Provider [place]9/21/2022  9:10 AM Rasch, JeArtist PaisNP [facility]   [facility]: [facility] Da[facility] 03/01/2021  Please schedule this patient for a In person postpartum visit in 4 weeks with the following provider: Any provider. Additional Postpartum F/U: n/a   Low risk pregnancy complicated by:  n/a Delivery mode:  Vaginal, Spontaneous  Anticipated Birth Control:  Would like to use condoms. Per patient's religion will not be sexually active till 40 days after delivery.  CaGladys DammeMD CoCantrilesidency, PGY-3   I spoke with and examined patient and agree with resident/PA-S/MS/SNM's note and plan of care.  KiRoma SchanzCNM, [facility]/24/2022 8:19 AM

## 2021-03-01 NOTE — Progress Notes (Addendum)
Labor Progress Note Halayna Blane is a 25 y.o. G3P0020 at [redacted]w[redacted]d presented for IOL postdates  S:  Patient very uncomfortable. Bleeding still noted but minimal  O:  BP 135/82 (BP Location: Right Arm)   Pulse 85   Temp 97.8 F (36.6 C) (Oral)   Resp 20   Ht 5' 6.5" (1.689 m)   Wt 81.9 kg   LMP 05/18/2020   BMI 28.71 kg/m   Fetal Tracing:  Baseline: 150 Variability: moderate Accels: none Decels: early  Toco: 1-3   CVE: Dilation: 6 Effacement (%): 100 Station: -1 Presentation: Vertex Exam by:: J Mbugua RN   A&P: 25 y.o. G3P0020 [redacted]w[redacted]d IOL SROM #Labor: Progressing well. Continue pitocin and AROM with next exam. MD aware of bleeding, likely cervical in nature #Pain: considering epidural, IV fentanyl #FWB: Cat 1 #GBS negative   Rolm Bookbinder, CNM 12:05 AM

## 2021-03-01 NOTE — Anesthesia Postprocedure Evaluation (Signed)
Anesthesia Post Note  Patient: Air traffic controller  Procedure(s) Performed: AN AD HOC LABOR EPIDURAL     Patient location during evaluation: Mother Baby Anesthesia Type: Epidural Level of consciousness: awake Pain management: satisfactory to patient Vital Signs Assessment: post-procedure vital signs reviewed and stable Respiratory status: spontaneous breathing Cardiovascular status: stable Anesthetic complications: no   No notable events documented.  Last Vitals:  Vitals:   03/01/21 0945 03/01/21 1117  BP: 110/62 112/64  Pulse: 92 82  Resp: 18 18  Temp: 36.8 C 37.5 C  SpO2: 100% 100%    Last Pain:  Vitals:   03/01/21 1117  TempSrc: Oral  PainSc: 0-No pain   Pain Goal: Patients Stated Pain Goal: 0 (02/28/21 2357)                 Joan Jackson

## 2021-03-01 NOTE — Lactation Note (Signed)
This note was copied from a baby's chart. Lactation Consultation Note  Patient Name: Joan Jackson [id number] Date: 03/01/2021   Age:27 hours  Mom was seen in L&D. I assisted with latch, but Mom has flat/short-shafted nipples. On her R breast the nipple inverts into the breast with compression. Infant was able to only latch briefly and was unable to maintain latch despite multiple attempts (both breasts were attempted).   After trying, Mom said that she was tired and wanted to sleep. If infant was still hungry, Mom said infant could have a bottle. I offered PDM and formula; Mom chose formula. Joan Paris, RN was made aware.  Lurline Hare [facility] 03/01/2021, 8:46 AM

## 2021-03-01 NOTE — Lactation Note (Signed)
This note was copied from a baby's chart. Lactation Consultation Note  Patient Name: Joan Jackson [id number] Date: 03/01/2021 Reason for consult: Initial assessment;1st time breastfeeding;Primapara;Term Age:27 hours   P1 mother whose infant is now 44 hours old.  This is a term baby at 41+0 weeks.  Mother's feeding preference is breast/formula.  LC order entered at 2.  Mother resting when I arrived; baby swaddled and asleep in the bassinet.  Baby has not attempted to breast feed in several hours.  Offered to awaken and attempt to latch and mother interested.  Taught hand expression.  Mother was able to express one drop of colostrum.  Attempted to latch, however, baby became very irritable and started crying.  Calmed her and attempted a second time with the same results.  Placed her STS on mother's chest where she fell asleep.  Mother's nipples are flat.  Manual pump provided with instructions for use.  #24 flange size is appropriate at this time.  Reviewed breast feeding basics.  Suggested mother call her RN/LC for latch assistance as needed.  Mom made aware of O/P services, breastfeeding support groups, community resources, and our phone # for post-discharge questions.  Support person present.     Maternal Data Has patient been taught Hand Expression?: Yes Does the patient have breastfeeding experience prior to this delivery?: No  Feeding Mother's Current Feeding Choice: Breast Milk and Formula  LATCH Score Latch: Too sleepy or reluctant, no latch achieved, no sucking elicited.  Audible Swallowing: None  Type of Nipple: Flat  Comfort (Breast/Nipple): Soft / non-tender  Hold (Positioning): Assistance needed to correctly position infant at breast and maintain latch.  LATCH Score: 4   Lactation Tools Discussed/Used    Interventions Interventions: Breast feeding basics reviewed;Assisted with latch;Breast massage;Hand express;Pre-pump if needed;Adjust position;Hand  pump;Position options;Support pillows;Education  Discharge Pump: Manual WIC Program: Yes  Consult Status Consult Status: Follow-up Date: 03/02/21 Follow-up type: In-patient    Joan Jackson 03/01/2021, 3:03 PM

## 2021-03-02 MED ORDER — ACETAMINOPHEN 325 MG PO TABS: 650.0000 mg | ORAL_TABLET | ORAL | 0 refills | Status: DC | PRN

## 2021-03-02 MED ORDER — IBUPROFEN 600 MG PO TABS: 600.0000 mg | ORAL_TABLET | Freq: Four times a day (QID) | ORAL | 0 refills | Status: DC

## 2021-03-02 NOTE — Progress Notes (Signed)
While this RN was in [place] with infant, MOB called out that she was feeling a little dizzy.  Another RN checked on her; vss and patient felt better once sitting.  Dizzines mild; patient stated she has not ate or drank much since yesterday afternoon; encouraged patient to eat and drink; will continue to monitor.

## 2021-03-03 LAB — SURGICAL PATHOLOGY

## 2021-03-03 NOTE — Lactation Note (Signed)
This note was copied from a baby's chart. Lactation Consultation Note  Patient Name: Joan Jackson [id number] Date: 03/03/2021 Reason for consult: Follow-up assessment;1st time breastfeeding;Primapara;Term Age:27 hours  LC in to visit with P1 Mom of term baby  on day of discharge.  Baby at 5% weight loss.  Mom has been latching baby to the breast and supplementing with bottle after.    Mom states her breasts are heavier today.    Encouraged STS and cue based feedings.  Mom to pre-pump using hand pump and then latch baby on deeply.  Engorgement prevention and treatment reviewed. Pediatrician appt tomorrow and IBCLC appt after [name])  Mom aware of OP lactation support and encouraged to call prn.  Lactation Tools Discussed/Used Tools: Pump;Bottle Breast pump type: Manual  Interventions Interventions: Breast feeding basics reviewed;Skin to skin;Hand express;Breast massage;Pre-pump if needed;Hand pump;Education  Discharge Discharge Education: Engorgement and breast care;Outpatient recommendation  Consult Status Consult Status: Complete Date: 03/03/21 Follow-up type: Call as needed    [name] 03/03/2021, 3:12 PM

## 2021-03-05 ENCOUNTER — Inpatient Hospital Stay (HOSPITAL_COMMUNITY)
Admission: AD | Admit: 2021-03-05 | Discharge: 2021-03-06 | Disposition: A | Discharge status: Home / Self Care | Payer: Medicaid Other | Attending: Obstetrics and Gynecology | Admitting: Obstetrics and Gynecology

## 2021-03-05 ENCOUNTER — Inpatient Hospital Stay (HOSPITAL_COMMUNITY): Payer: Medicaid Other

## 2021-03-05 DIAGNOSIS — Z91030 Bee allergy status: Secondary | ICD-10-CM | POA: Diagnosis not present

## 2021-03-05 DIAGNOSIS — IMO0002 Reserved for concepts with insufficient information to code with codable children: Secondary | ICD-10-CM

## 2021-03-05 MED ORDER — MORPHINE SULFATE (PF) 4 MG/ML IV SOLN
2.0000 mg | Freq: Once | INTRAVENOUS | Status: AC
  Administered 2021-03-05: 2 mg via INTRAMUSCULAR
  Filled 2021-03-05: qty 1

## 2021-03-05 NOTE — MAU Provider Note (Signed)
History     CSN: [id number]  Arrival date and time: 03/05/21 2232   Event Date/Time   First Provider Initiated Contact with Patient 03/05/21 2239      No chief complaint on file.  Joan Jackson is a 25 y.o. G3P1021 at 5 Days Postpartum from a SVD who receives care at [facility].  She presents today for questionable retained products of conception.  She states that around 2130 she noticed something protruding from her vagina.  She states she breastfeeding and started having abdominal pain.  She states she went to the bathroom and noticed a large sac hanging from her vagina.  She states she tried to remove the sac but was unable to.  She states her bleeding has been "regular."  Patient informed that provider will attempt to remove products and if unsuccessful will contact MD who is female.  Patient states that if necessary she will see a female provider.    OB History     Gravida  3   Para  1   Term  1   Preterm  0   AB  2   Living  1      SAB  2   IAB  0   Ectopic  0   Multiple  0   Live Births  1           Past Medical History:  Diagnosis Date   Hyperthyroidism     No past surgical history on file.  Family History  Problem Relation Age of Onset   Diabetes Mother    Hypertension Mother    Anemia Mother    Diabetes Father    Anemia Sister     Social History   Tobacco Use   Smoking status: Never   Smokeless tobacco: Never  Vaping Use   Vaping Use: Never used  Substance Use Topics   Alcohol use: No    Alcohol/week: 0.0 standard drinks   Drug use: No    Allergies:  Allergies  Allergen Reactions   Bee Venom Swelling    Medications Prior to Admission  Medication Sig Dispense Refill Last Dose   acetaminophen (TYLENOL) 325 MG tablet Take 2 tablets (650 mg total) by mouth every 4 (four) hours as needed (for pain scale < 4). 60 tablet 0    Doxylamine-Pyridoxine (DICLEGIS) 10-10 MG TBEC Take 2 tablets by mouth daily. Take 2 tablets at bedtime. If symptoms  persist, add 1 tab in the AM starting on day 3. If symptoms persist, add 1 tab in the PM starting day 4. (Patient not taking: No sig reported) 100 tablet 0    famotidine (PEPCID) 20 MG tablet Take 1 tablet (20 mg total) by mouth 2 (two) times daily. (Patient taking differently: Take 20 mg by mouth 2 (two) times daily as needed for heartburn or indigestion.) 60 tablet 1    ibuprofen (ADVIL) 600 MG tablet Take 1 tablet (600 mg total) by mouth every 6 (six) hours. 30 tablet 0    levothyroxine (SYNTHROID) 75 MCG tablet Take 75 mcg by mouth See admin instructions. daily alternating with daily      Prenat-Fe Poly-Methfol-FA-DHA (VITAFOL ULTRA) 29-0.6-0.4-200 MG CAPS Take 1 tablet by mouth daily. (Patient not taking: Reported on 03/01/2021) 30 capsule 12     Review of Systems  Constitutional:  Negative for chills and fever.  Gastrointestinal:  Positive for abdominal pain (Cramping 8/10). Negative for nausea and vomiting.  Genitourinary:  Positive for dysuria and vaginal bleeding.  Negative for difficulty urinating and vaginal discharge.  Musculoskeletal:  Positive for back pain (10/10-Bilateral Lower).  Neurological:  Positive for light-headedness. Negative for dizziness and headaches.  Physical Exam   Last menstrual period 05/18/2020, unknown if currently breastfeeding.  Physical Exam Vitals reviewed. Exam conducted with a chaperone present.  Constitutional:      General: She is in acute distress.     Appearance: Normal appearance. She is not toxic-appearing.  HENT:     Head: Normocephalic and atraumatic.  Eyes:     Conjunctiva/sclera: Conjunctivae normal.  Cardiovascular:     Rate and Rhythm: Normal rate.  Pulmonary:     Effort: Pulmonary effort is normal. No respiratory distress.  Genitourinary:    Comments: Sterile Speculum Exam: -Normal External Genitalia: Non tender, no apparent discharge or blood at introitus.  -Bimanual Exam:  Os dilated with suspected membranes  appreciated. No signs of uterine prolapse.  -Vaginal Vault: Apparent POCs sitting at introitus.  Small sac that appears shiny with grayish-blue tint that extends into cervical os. Attempted to tease out with ring forceps with some resistance felt.  -Cervix:Pink, no lesions, cysts, or polyps.  Appears slightly open. No active bleeding from os Musculoskeletal:     Cervical back: Normal range of motion.     Right lower leg: No edema.     Left lower leg: No edema.  Skin:    General: Skin is warm and dry.  Neurological:     Mental Status: She is alert and oriented to person, place, and time.  Psychiatric:        Mood and Affect: Mood normal.        Behavior: Behavior normal.        Thought Content: Thought content normal.    MAU Course  Procedures No results found for this or any previous visit (from the past 24 hour(s)). US PELVIS (TRANSABDOMINAL ONLY)  Result Date: 03/05/2021 CLINICAL DATA:  Five days postpartum. Concern for retained products conception. EXAM: TRANSABDOMINAL AND TRANSVAGINAL ULTRASOUND OF PELVIS TECHNIQUE: Both transabdominal and transvaginal ultrasound examinations of the pelvis were performed. Transabdominal technique was performed for global imaging of the pelvis including uterus, ovaries, adnexal regions, and pelvic cul-de-sac. It was necessary to proceed with endovaginal exam following the transabdominal exam to visualize the endometrium ovaries. COMPARISON:  None FINDINGS: Uterus Measurements: 20 x 7 x 13 cm = volume: 995 mL. The uterus is anteverted and appears enlarged in keeping with postpartum state. Endometrium Thickness: 13 mm. There is thickened appearance of the endometrium with echogenic content in the lower endometrium. No significant vascularity noted within the endometrial content. Findings may represent blood products/clot. Retained product of conception is favored less likely in the absence of flow, but not entirely excluded correlation with clinical exam and  HCG levels recommended. Right ovary Measurements: 2.8 x 2.6 x 2.9 cm = volume: 11 mL. Normal appearance/no adnexal mass. Left ovary Measurements: 4.2 x 2.2 x 3.0 cm = volume: 14 mL. Normal appearance/no adnexal mass. Other findings No abnormal free fluid. IMPRESSION: Thickened and echogenic endometrium may represent blood products/clot. Retained product of conception is less likely but not excluded clinical correlation is recommended Electronically Signed   By: Elgie Collard M.D.   On: 03/05/2021 23:54    MDM Pelvic US MD Consult/Procedure Pain Medication Assessment and Plan  25 year old 5 Days Postpartum Retained Products   -Provider attempts to remove retained products.  However, resistance noted. -Dr. Mitzi Hansen consulted and informed of patient status, evaluation, interventions,  and results.  *To bedside to remove and successful. *Will send to pathology. -Patient given 4mg  IM Morphine prior to procedure. -Will send for and await results. -Patient instructed not to eat or drink.    [place] 03/05/2021, 10:39 PM   Reassessment (1:12 AM)  -03/07/2021 returns as above. -Dr. [place] updated on results and advised discharge with no further interventions. -Patient informed of POC. -No other questions or concerns. -Bleeding Precautions Given. -Encouraged to call or return to MAU if symptoms worsen or with the onset of new symptoms. -Discharged to home in stable condition.  Vergie Living MSN, CNM Advanced Practice Provider, Center for [facility]

## 2021-03-05 NOTE — MAU Note (Signed)
..  Joan Jackson is a 25 y.o. at 5 days PP here in MAU reporting: something protruding out, describes it as a sack with blood in it. Reports back and abdominal pain. Says she attempted to pull it but was unable to and it went back in when she used the restroom here. Reports her bleeding is normal.

## 2021-03-09 LAB — SURGICAL PATHOLOGY

## 2021-03-11 ENCOUNTER — Telehealth (HOSPITAL_COMMUNITY): Payer: Self-pay | Admitting: *Deleted

## 2021-03-11 NOTE — Telephone Encounter (Signed)
Patient voiced no questions or concerns regarding her own health at this time. EPDS = 3. Patient asked about infant's umbilical cord stump. RN advised patient that it typically falls off by 58 weeks of age. Reviewed care instructions with patient - patient verbalized understanding. No other questions or concerns voiced at this time. Patient reported infant sleeps in a crib. Stated, "Sometimes she sleeps on her back and sometimes she turns to her side." RN reviewed ABCs of safe sleep - patient verbalized understanding. Deforest Hoyles, RN 03/11/21, 1753.

## 2021-03-31 ENCOUNTER — Ambulatory Visit: Payer: Medicaid Other | Admitting: Obstetrics and Gynecology

## 2021-04-21 ENCOUNTER — Other Ambulatory Visit: Payer: Self-pay

## 2021-04-21 ENCOUNTER — Encounter: Payer: Self-pay | Admitting: Obstetrics and Gynecology

## 2021-04-21 ENCOUNTER — Ambulatory Visit (INDEPENDENT_AMBULATORY_CARE_PROVIDER_SITE_OTHER): Payer: Medicaid Other | Admitting: Obstetrics and Gynecology

## 2021-04-21 VITALS — BP 122/76 | HR 76 | Resp 16 | Ht 67.0 in | Wt 167.0 lb

## 2021-04-21 DIAGNOSIS — E039 Hypothyroidism, unspecified: Secondary | ICD-10-CM | POA: Diagnosis not present

## 2021-04-21 DIAGNOSIS — Z23 Encounter for immunization: Secondary | ICD-10-CM

## 2021-04-21 NOTE — Progress Notes (Signed)
Post Partum Visit Note  Joan Jackson is a 25 y.o. G51P1021 female who presents for a postpartum visit. She is 7 weeks postpartum following a normal spontaneous vaginal delivery.  I have fully reviewed the prenatal and intrapartum course. The delivery was at 41 gestational weeks.  Anesthesia: epidural. Postpartum course has been eventful with chorioamnionitis. Baby is doing well. Baby is feeding by both breast and bottle - Gerber Gentle Ease . Bleeding no bleeding. Bowel function is normal. Bladder function is normal. Patient is sexually active. Contraception method is condoms. Postpartum depression screening: negative.   The pregnancy intention screening data noted above was reviewed. Potential methods of contraception were discussed. The patient elected to proceed with No data recorded.   Edinburgh Postnatal Depression Scale - 04/21/21 0929       Edinburgh Postnatal Depression Scale:  In the Past 7 Days   I have been able to laugh and see the funny side of things. 0    I have looked forward with enjoyment to things. 0    I have blamed myself unnecessarily when things went wrong. 2    I have been anxious or worried for no good reason. 0    I have felt scared or panicky for no good reason. 0    Things have been getting on top of me. 0    I have been so unhappy that I have had difficulty sleeping. 0    I have felt sad or miserable. 0    I have been so unhappy that I have been crying. 0    The thought of harming myself has occurred to me. 0    Edinburgh Postnatal Depression Scale Total 2             Health Maintenance Due  Topic Date Due   COVID-19 Vaccine (3 - Booster for Pfizer series) 06/24/2020    The following portions of the patient's history were reviewed and updated as appropriate: allergies, current medications, past family history, past medical history, past social history, past surgical history, and problem list.  Review of Systems Pertinent items are noted in  HPI.  Objective:  BP 122/76   Pulse 76   Resp 16   Ht 5\' 7"  (1.702 m)   Wt 167 lb (75.8 kg)   LMP 05/18/2020   Breastfeeding Yes   BMI 26.16 kg/m    General:  alert and cooperative  Lungs: clear to auscultation bilaterally  Heart:  regular rate and rhythm, S1, S2 normal, no murmur, click, rub or gallop  Abdomen: soft, non-tender; bowel sounds normal; no masses,  no organomegaly   GU exam:  normal       Assessment:   1. Hypothyroidism, unspecified type  - Thyroid Panel With TSH   Normal postpartum exam.   Plan:   Essential components of care per ACOG recommendations:  1.  Mood and well being: Patient with positive depression screening today. Reviewed local resources for support.  - Patient tobacco use? No.   - hx of drug use? No.    2. Infant care and feeding:  -Patient currently breastmilk feeding? Yes. Reviewed importance of draining breast regularly to support lactation.  -Social determinants of health (SDOH) reviewed in EPIC. No concerns. 3. Sexuality, contraception and birth spacing - Patient does not want a pregnancy in the next year.  Desired family size is unknown children.  - Reviewed forms of contraception in tiered fashion. Patient desired condoms today.   - Discussed birth spacing  of 18 months  4. Sleep and fatigue -Encouraged family/partner/community support of 4 hrs of uninterrupted sleep to help with mood and fatigue  5. Physical Recovery  - Discussed patients delivery and complications. She describes her labor as good. Had difficult PP state with retained POC's  - Patient had a Vaginal problems after delivery including retained products of concectpion  . Patient had a 2nd degree laceration. Perineal healing reviewed. Patient expressed understanding - Patient has urinary incontinence? No. - Patient is safe to resume physical and sexual activity  6.  Health Maintenance - HM due items addressed Yes - Last pap smear  Diagnosis  Date Value Ref Range  Status  01/27/2021   Final   - Negative for intraepithelial lesion or malignancy (NILM)   Pap smear not done at today's visit.  -Breast Cancer screening indicated? No.   7. Chronic Disease/Pregnancy Condition follow up: Hypothyroidism - TSH levels collected today. She is not taking her medications.   - PCP follow up  Venia Carbon, NP Center for [employer], [facility]

## 2021-04-22 LAB — THYROID PANEL WITH TSH
Free Thyroxine Index: 2 (ref 1.4–3.8)
T3 Uptake: 28 % (ref 22–35)
T4, Total: 7.1 ug/dL (ref 5.1–11.9)
TSH: 2 mIU/L

## 2021-06-21 ENCOUNTER — Telehealth: Payer: Self-pay

## 2021-06-21 ENCOUNTER — Telehealth: Payer: Self-pay | Admitting: *Deleted

## 2021-06-21 ENCOUNTER — Telehealth (INDEPENDENT_AMBULATORY_CARE_PROVIDER_SITE_OTHER): Payer: Medicaid Other | Admitting: Obstetrics & Gynecology

## 2021-06-21 DIAGNOSIS — Z30011 Encounter for initial prescription of contraceptive pills: Secondary | ICD-10-CM

## 2021-06-21 MED ORDER — NORETHINDRONE 0.35 MG PO TABS: 1.0000 | ORAL_TABLET | Freq: Every day | ORAL | 11 refills | Status: DC

## 2021-06-21 NOTE — Telephone Encounter (Signed)
Attempted to call pt to begin virtual visit. Pt did not answer. 

## 2021-06-21 NOTE — Telephone Encounter (Signed)
Returned call from 11:37 AM. Patient advised that she can use her phone for MyChart appointment, as long as she has access to MyChart on the phone.

## 2021-06-21 NOTE — Progress Notes (Signed)
    GYNECOLOGY VIRTUAL VISIT ENCOUNTER NOTE  Provider location: Center for [facility]   Patient location: Home  I connected with Joan Jackson on 06/21/21 at  1:50 PM EST by MyChart Video Encounter and verified that I am speaking with the correct person using two identifiers.   I discussed the limitations, risks, security and privacy concerns of performing an evaluation and management service virtually and the availability of in person appointments. I also discussed with the patient that there may be a patient responsible charge related to this service. The patient expressed understanding and agreed to proceed.   History:  Joan Jackson is a 25 y.o. G7P1021 female being evaluated today for birth control other than condoms.  Breast feeding and wants to have a  monthly menses.       Past Medical History:  Diagnosis Date   Hyperthyroidism    No past surgical history on file. The following portions of the patient's history were reviewed and updated as appropriate: allergies, current medications, past family history, past medical history, past social history, past surgical history and problem list.   Review of Systems:  Pertinent items noted in HPI and remainder of comprehensive ROS otherwise negative.  Physical Exam:   General:  Alert, oriented and cooperative. Patient appears to be in no acute distress.  Mental Status: Normal mood and affect. Normal behavior. Normal judgment and thought content.   Respiratory: Normal respiratory effort, no problems with respiration noted  Rest of physical exam deferred due to type of encounter  Labs and Imaging No results found for this or any previous visit (from the past 336 hour(s)). No results found.     Assessment and Plan:  Patient is a 25 year old female desiring a monthly menstrual cycle and a reliable form of birth control.  Patient will be 6 months postpartum and 2 more months.  At that point patient will consider going  to a combination estrogen progesterone contraceptive pill.  She will take the placebo pills so she can have a menstrual cycle.  For right now she will be started on Micronor.  Rx sent to pharmacy.    I discussed the assessment and treatment plan with the patient. The patient was provided an opportunity to ask questions and all were answered. The patient agreed with the plan and demonstrated an understanding of the instructions.   The patient was advised to call back or seek an in-person evaluation/go to the ED if the symptoms worsen or if the condition fails to improve as anticipated.  I provided 23 minutes time to encounter   Elsie Lincoln, MD Center for [facility], [facility]

## 2021-06-28 ENCOUNTER — Encounter: Payer: Self-pay | Admitting: Obstetrics & Gynecology

## 2021-09-06 ENCOUNTER — Ambulatory Visit: Payer: Medicaid Other | Admitting: Obstetrics & Gynecology

## 2021-09-06 ENCOUNTER — Encounter: Payer: Self-pay | Admitting: Obstetrics & Gynecology

## 2021-09-06 ENCOUNTER — Other Ambulatory Visit: Payer: Self-pay

## 2021-09-06 VITALS — BP 120/70 | HR 73 | Resp 16 | Ht 67.0 in | Wt 170.0 lb

## 2021-09-06 DIAGNOSIS — N921 Excessive and frequent menstruation with irregular cycle: Secondary | ICD-10-CM | POA: Diagnosis not present

## 2021-09-06 DIAGNOSIS — E039 Hypothyroidism, unspecified: Secondary | ICD-10-CM | POA: Diagnosis not present

## 2021-09-06 NOTE — Progress Notes (Signed)
° °  Subjective:    Patient ID: Joan Jackson, female    DOB: 1996/06/08, 26 y.o.   MRN: [id number]  HPI  26 yo female presents to discuss birth control.  Pt had some spotting.  Has not taken pregnancy test.  Pt wants to see if irregular bleeding stops and if regular menses returns. She skipped several months prior to pregnancy.  She feels better off medicine.  Will do a UPT today.    Review of Systems  Constitutional: Negative.   Respiratory: Negative.    Cardiovascular: Negative.   Gastrointestinal: Negative.   Genitourinary:  Positive for menstrual problem.      Objective:   Physical Exam Vitals reviewed.  Constitutional:      General: She is not in acute distress.    Appearance: She is well-developed.  HENT:     Head: Normocephalic and atraumatic.  Eyes:     Conjunctiva/sclera: Conjunctivae normal.  Cardiovascular:     Rate and Rhythm: Normal rate.  Pulmonary:     Effort: Pulmonary effort is normal.  Skin:    General: Skin is warm and dry.  Neurological:     Mental Status: She is alert and oriented to person, place, and time.  Psychiatric:        Mood and Affect: Mood normal.          Assessment & Plan:  26 yo female who desires to come off micronor.  Condoms for birth control Continue PNV UPT today due to spotting If no menses for 2 months then she will message our office and start combination OCPs. TSH today (has been off thyroid meds).

## 2021-09-07 LAB — TSH: TSH: 1.61 mIU/L

## 2022-09-05 IMAGING — US US MFM OB DETAIL+14 WK
1 series · 13 of 28 positions shown · non-contrast
Comparison: none

[Series 1: us mfm ob detail+14 wk · 13 of 152 slices shown]
[im 6/152]
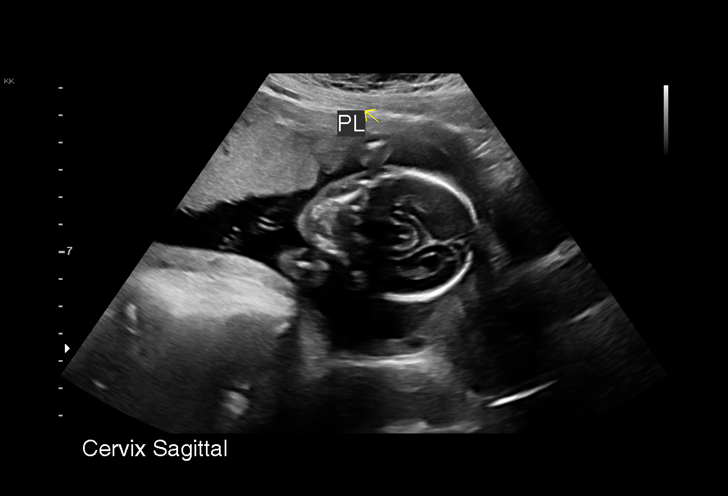
[im 17/152]
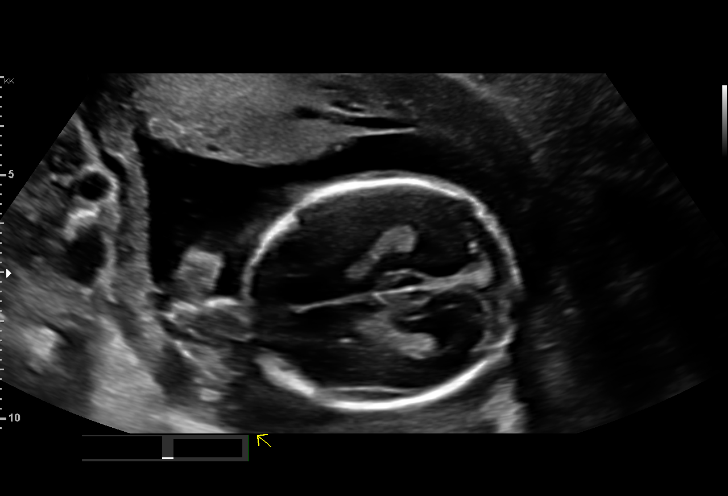
[im 28/152]
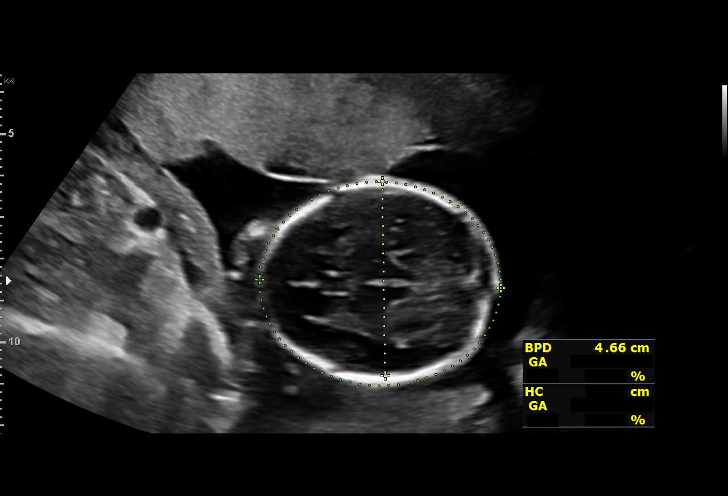
[im 40/152]
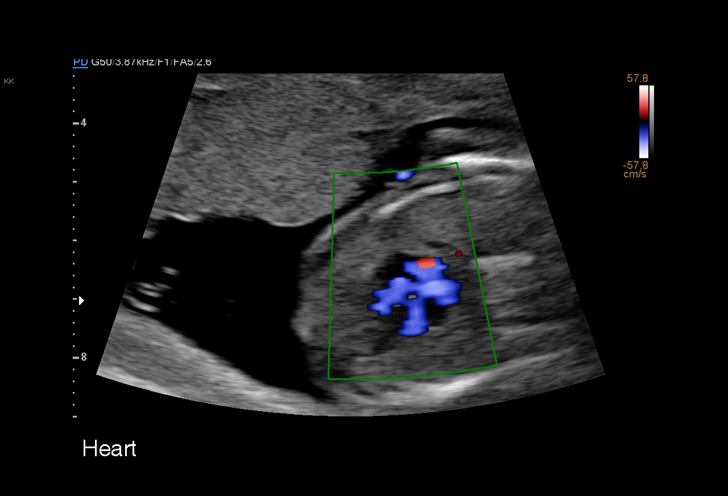
[im 51/152]
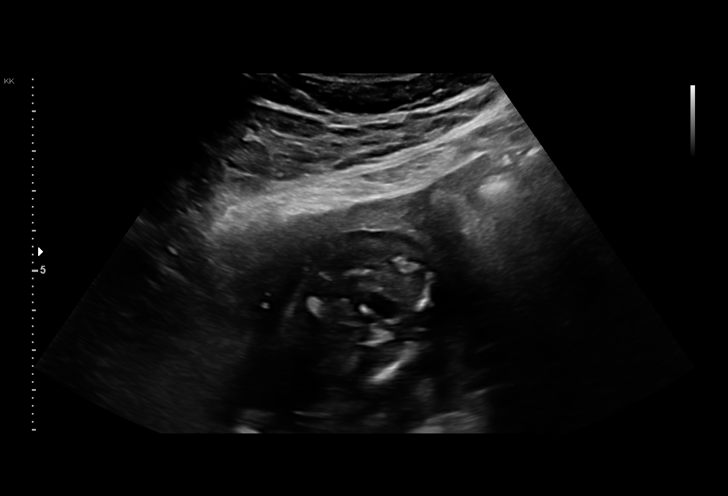
[im 62/152]
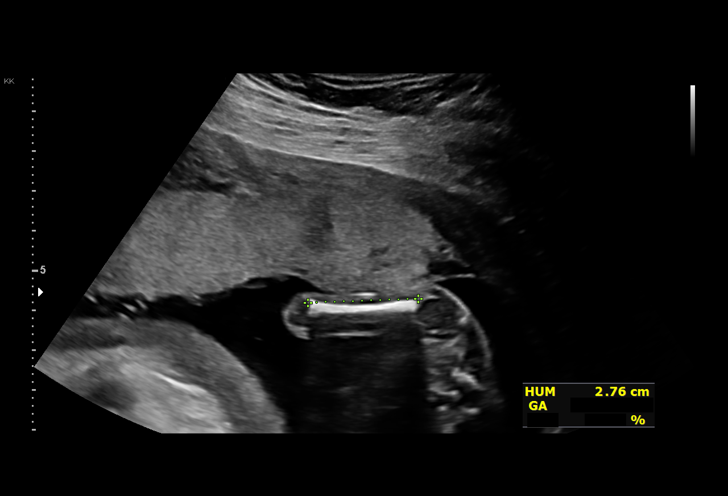
[im 79/152]
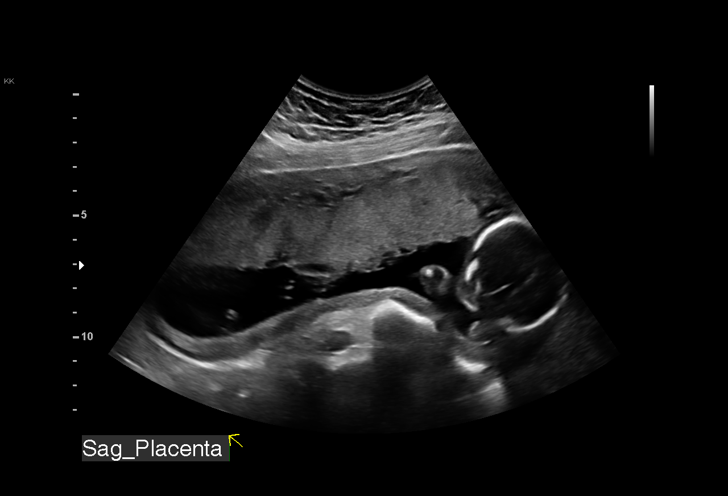
[im 90/152]
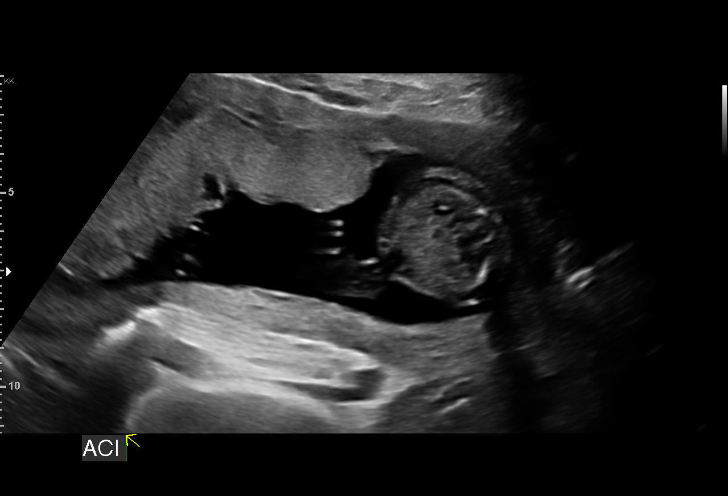
[im 101/152]
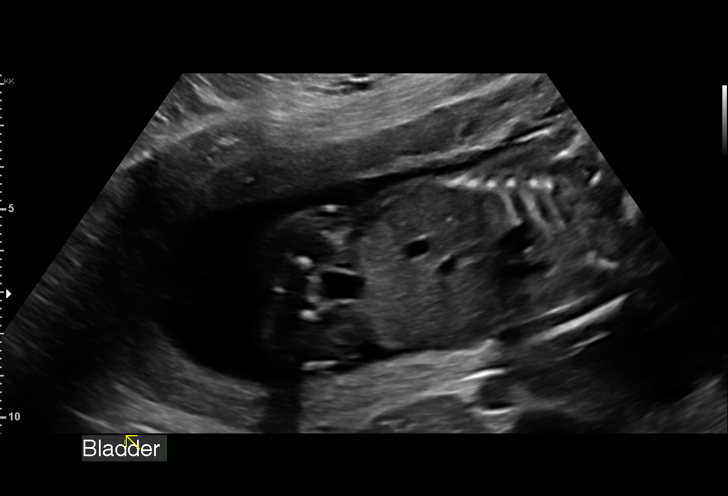
[im 112/152]
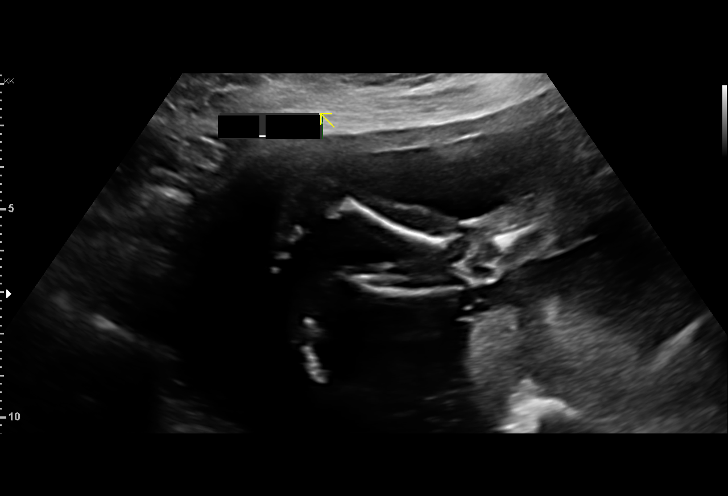
[im 124/152]
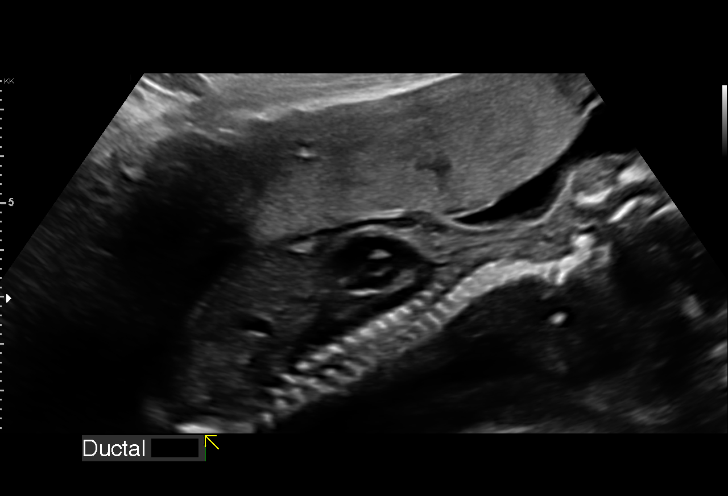
[im 135/152]
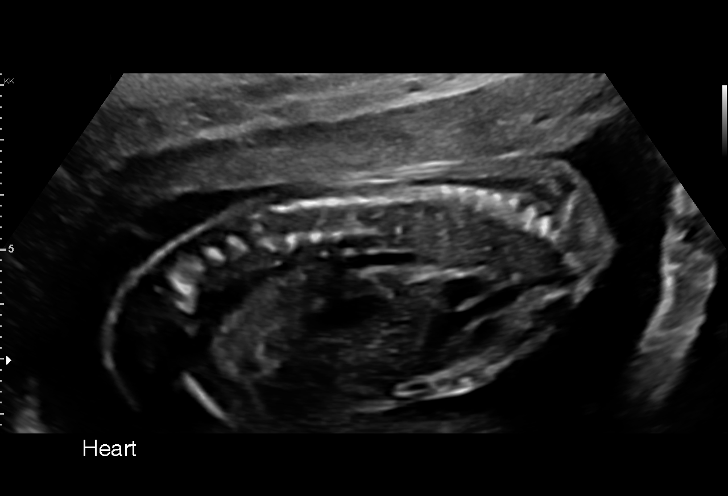
[im 146/152]
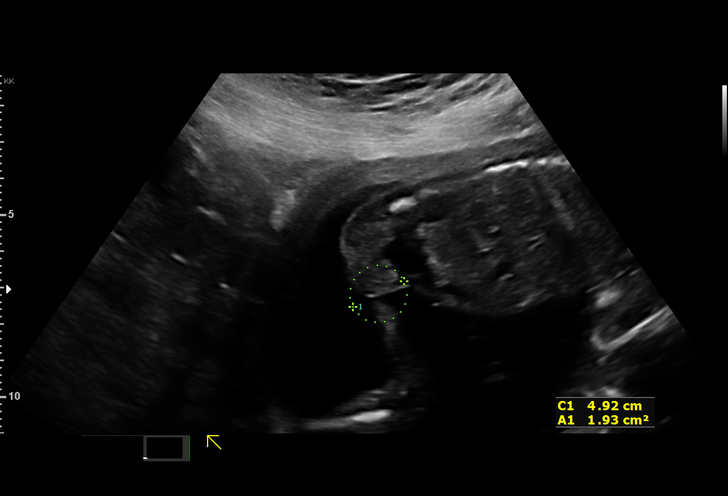

[13 of 28 positions shown; findings below may reference images not displayed]

Indications

 19 weeks gestation of pregnancy
 Encounter for antenatal screening for
 malformations
 Hypothyroid (Synthroid)(Levothroxine
 Sodium)
 Low Risk NIPS(Negative Horizon)(Negative
 AFP)
Fetal Evaluation

 Num Of Fetuses:         1
 Fetal Heart Rate(bpm):  143
 Cardiac Activity:       Observed
 Presentation:           Cephalic
 Placenta:               Anterior
 P. Cord Insertion:      Visualized

 Amniotic Fluid
 AFI FV:      Within normal limits

                             Largest Pocket(cm)

Biometry

 BPD:      46.9  mm     G. Age:  20w 1d         75  %    CI:        77.78   %    70 - 86
                                                         FL/HC:      18.2   %    16.8 -
 HC:      168.3  mm     G. Age:  19w 3d         38  %    HC/AC:      1.17        1.09 -
 AC:      144.1  mm     G. Age:  19w 5d         51  %    FL/BPD:     65.5   %
 FL:       30.7  mm     G. Age:  19w 4d         40  %    FL/AC:      21.3   %    20 - 24
 HUM:      27.8  mm     G. Age:  18w 6d         35  %
 CER:      20.1  mm     G. Age:  19w 3d         53  %
 NFT:       4.3  mm

 LV:        7.2  mm
 CM:          3  mm

 Est. FW:     304  gm    0 lb 11 oz      49  %
OB History

 Gravidity:    3         Term:   0        Prem:   0        SAB:   2
 TOP:          0       Ectopic:  0        Living: 0
Gestational Age

 LMP:           19w 4d        Date:  05/18/20                 EDD:   02/22/21
 U/S Today:     19w 5d                                        EDD:   02/21/21
 Best:          19w 4d     Det. By:  LMP  (05/18/20)          EDD:   02/22/21
Anatomy

 Cranium:               Appears normal         Aortic Arch:            Appears normal
 Cavum:                 Appears normal         Ductal Arch:            Appears normal
 Ventricles:            Appears normal         Diaphragm:              Appears normal
 Choroid Plexus:        Appears normal         Stomach:                Appears normal, left
                                                                       sided
 Cerebellum:            Appears normal         Abdomen:                Appears normal
 Posterior Fossa:       Appears normal         Abdominal Wall:         Appears nml (cord
                                                                       insert, abd wall)
 Nuchal Fold:           Appears normal         Cord Vessels:           Appears normal (3
                                                                       vessel cord)
 Face:                  Appears normal         Kidneys:                Appear normal
                        (orbits and profile)
 Lips:                  Appears normal         Bladder:                Appears normal
 Thoracic:              Appears normal         Spine:                  Appears normal
 Heart:                 Appears normal         Upper Extremities:      Appears normal
                        (4CH, axis, and
                        situs)
 RVOT:                  Not well visualized    Lower Extremities:      Appears normal
 LVOT:                  Appears normal

 Other:  Parents do not wish to know sex of fetus. Heels visualized. Open
         hands visualized. Technically difficult due to fetal position.
Cervix Uterus Adnexa

 Cervix
 Length:            3.6  cm.
 Normal appearance by transabdominal scan.
Impression

 G3 P0.  Patient is here for fetal anatomy scan.  On cell free
 fetal DNA screening, the risks of fetal aneuploidies are not
 increased.  MSAFP screening showed low risk for open
 neural tube defects.
 Patient has hypothyroidism and takes levothyroxine
 supplements.  Most recent TSH level (07/30/2020) was within
 normal range.

 We performed fetal anatomy scan. No makers of
 aneuploidies or fetal structural defects are seen. Fetal
 biometry is consistent with her previously-established dates.
 Amniotic fluid is normal and good fetal activity is seen.
 Patient understands the limitations of ultrasound in detecting
 fetal anomalies.
 Patient requested not to be scanned by a male provider. My
 interpretation is based on reviewing the images of
 sonographer.
Recommendations

 -An appointment was made for her to return in 4 weeks for
 completion of fetal anatomy (RVOT).
                 Limage, Alva

## 2022-12-17 IMAGING — US US MFM OB FOLLOW-UP
1 series · 13 of 28 positions shown · non-contrast
Comparison: none

[Series 1: us mfm ob follow-up · 13 of 57 slices shown]
[im 3/57]
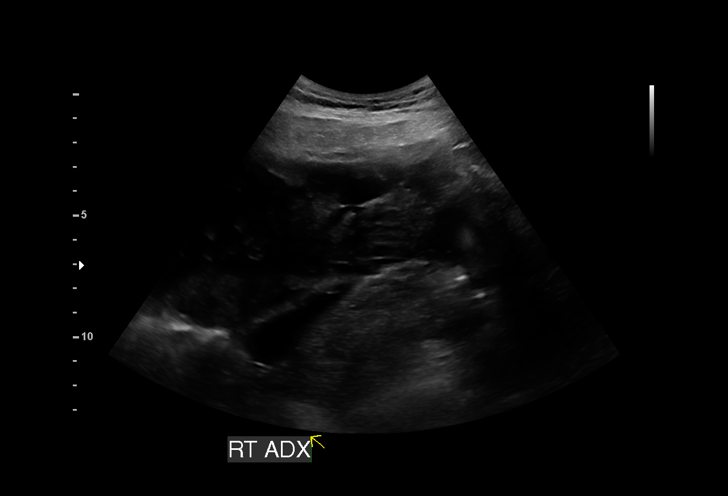
[im 7/57]
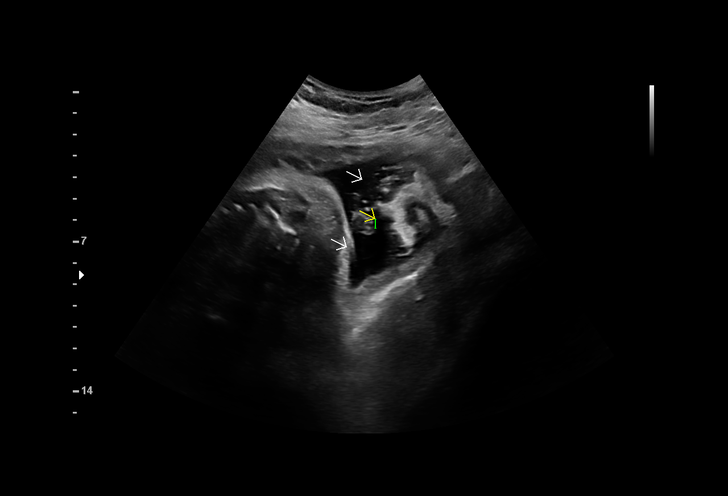
[im 11/57]
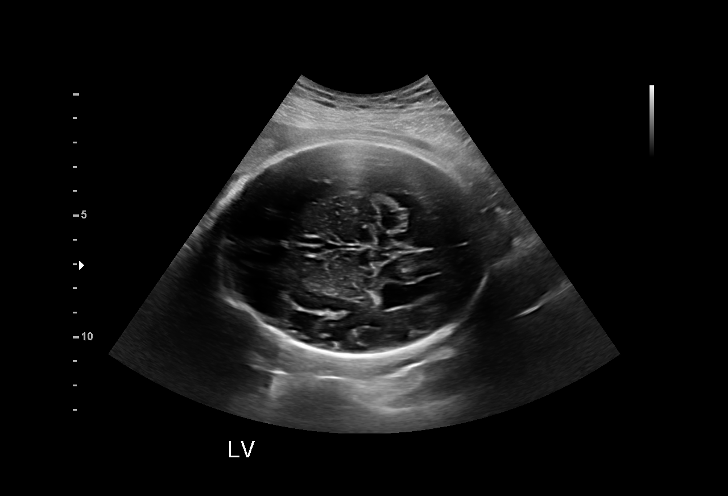
[im 15/57]
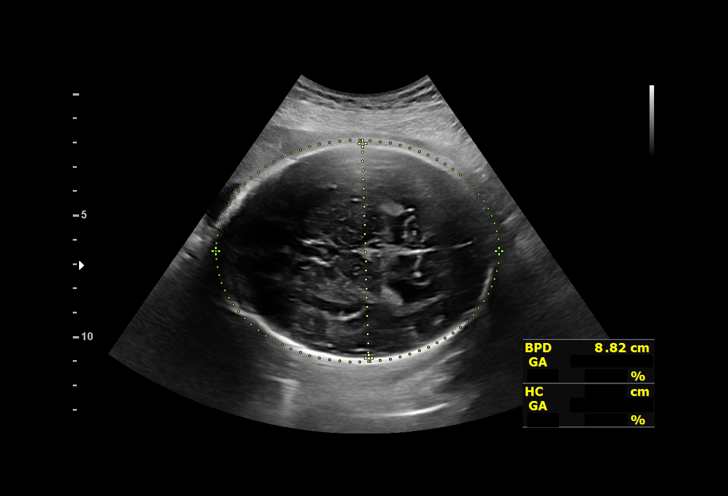
[im 19/57]
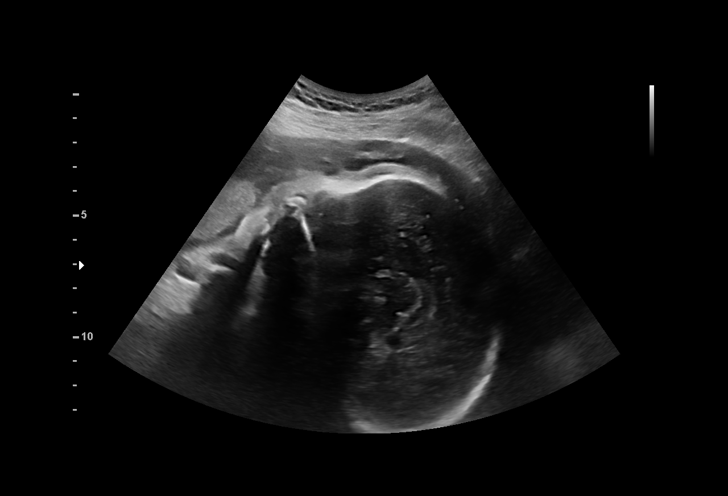
[im 23/57]
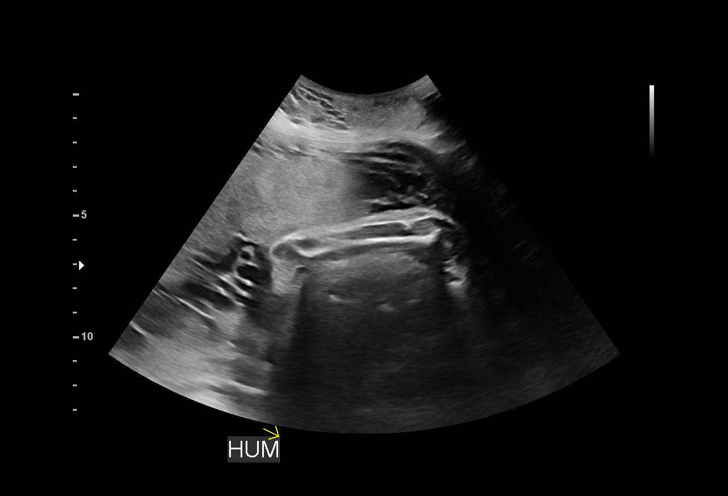
[im 30/57]
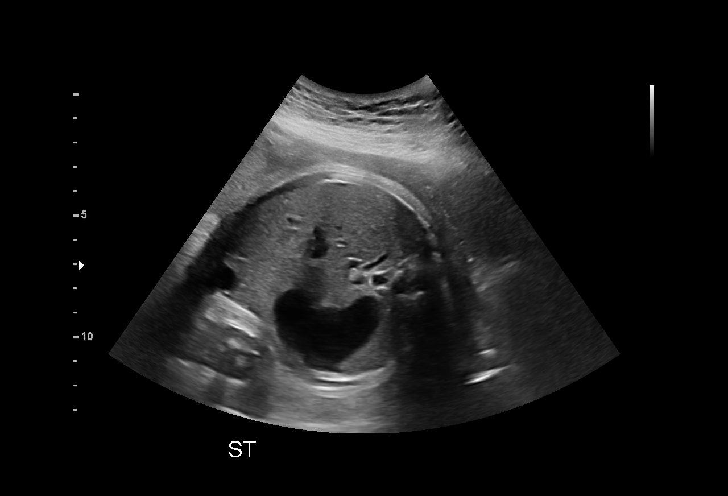
[im 34/57]
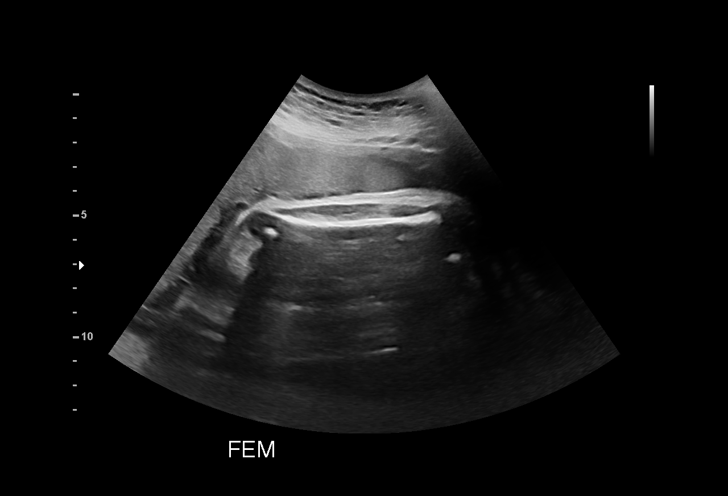
[im 38/57]
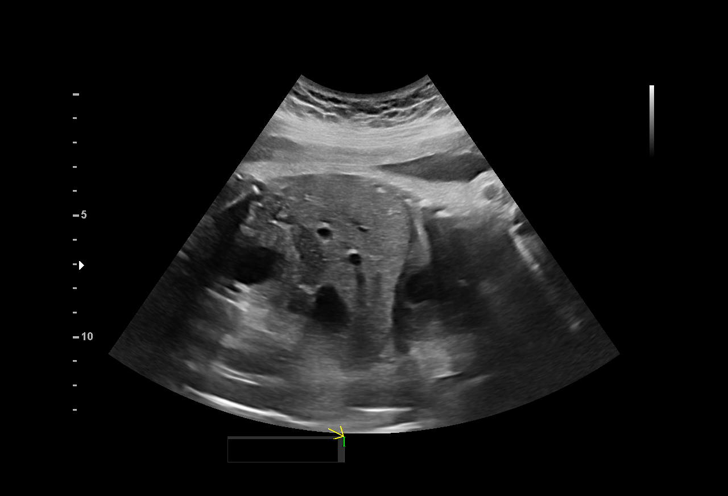
[im 42/57]
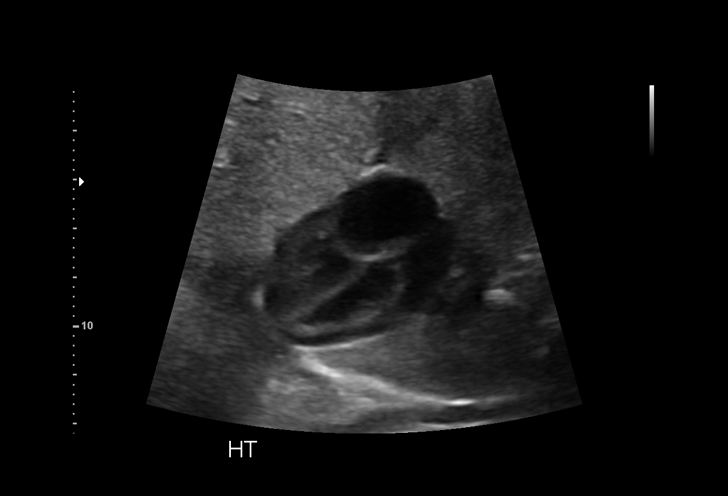
[im 46/57]
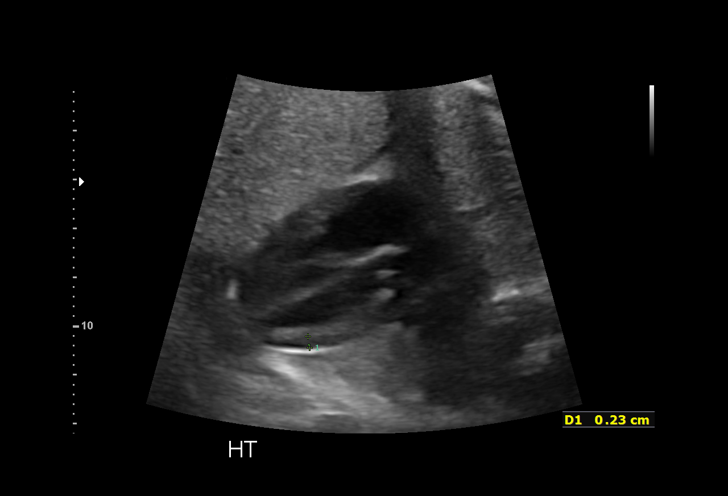
[im 50/57]
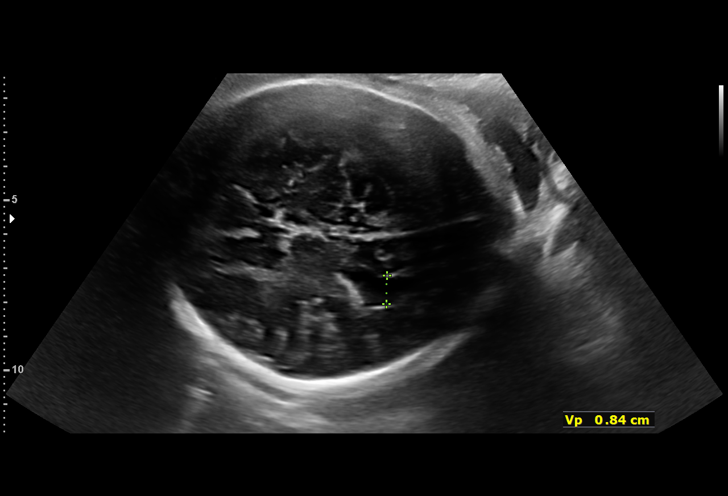
[im 54/57]
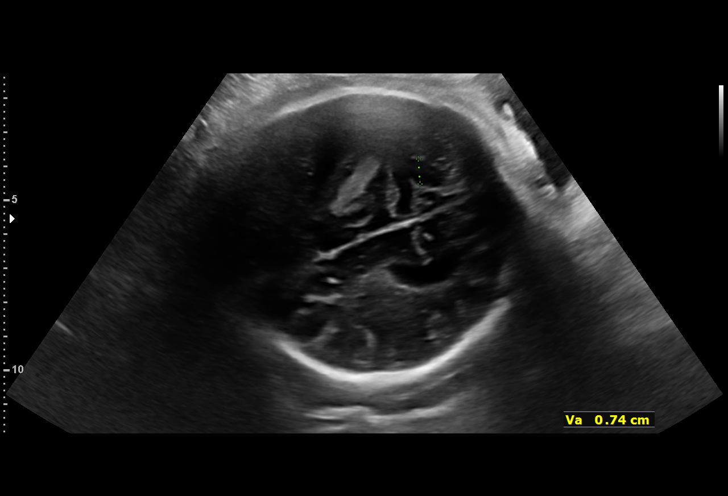

[13 of 28 positions shown; findings below may reference images not displayed]

Indications

 34 weeks gestation of pregnancy
 Hypothyroid (Synthroid)(Levothroxine
 Sodium)
 Low Risk NIPS(Negative Horizon)(Negative
 AFP)
 Cerebral ventriculomegaly
Fetal Evaluation

 Num Of Fetuses:         1
 Preg. Location:         Intrauterine
 Fetal Heart Rate(bpm):  160
 Cardiac Activity:       Observed
 Presentation:           Cephalic
 Placenta:               Anterior

 Amniotic Fluid
 AFI FV:      Within normal limits

 AFI Sum(cm)     %Tile       Largest Pocket(cm)
 12.07           35

 RUQ(cm)       RLQ(cm)       LUQ(cm)        LLQ(cm)
 4.92          3.32          0
Biometry
 BPD:      88.2  mm     G. Age:  35w 5d         84  %    CI:           72   %    70 - 86
                                                         FL/HC:      20.0   %    19.4 -
 HC:      330.8  mm     G. Age:  37w 5d         91  %    HC/AC:      1.09        0.96 -
 AC:       304   mm     G. Age:  34w 3d         57  %    FL/BPD:     74.9   %    71 - 87
 FL:       66.1  mm     G. Age:  34w 0d         34  %    FL/AC:      21.7   %    20 - 24
 HUM:      57.4  mm     G. Age:  33w 2d         40  %
 LV:         10  mm

 Est. FW:    2332  gm      5 lb 8 oz     58  %
OB History

 Gravidity:    3         Term:   0        Prem:   0        SAB:   2
 TOP:          0       Ectopic:  0        Living: 0
Gestational Age

 LMP:           34w 2d        Date:  05/18/20                 EDD:   02/22/21
 U/S Today:     35w 3d                                        EDD:   02/14/21
 Best:          34w 2d     Det. By:  LMP  (05/18/20)          EDD:   02/22/21
Anatomy

 Cranium:               Appears normal         Stomach:                Appears normal, left
                                                                       sided
 Cavum:                 Appears normal         Kidneys:                Appear normal
 Ventricles:            Ventriculomegaly       Bladder:                Appears normal
                        (10 mm)
 Face:                  Profile appears        Upper Extremities:      Limited Views
                        normal
 Heart:                 Limited Views          Lower Extremities:      Limited Views
Cervix Uterus Adnexa

 Cervix
 Not visualized (advanced GA >51wks)

 Uterus
 No abnormality visualized.

 Right Ovary
 Not visualized.

 Left Ovary
 Not visualized.

 Cul De Sac
 No free fluid seen.

 Adnexa
 No abnormality visualized.
Impression

 Ms. Styra returned for fetal growth assessment.  She has
 hypothyroidism and takes levothyroxine supplements.  Her
 most recent TSH levels are within normal range.  Patient
 reports good fetal movements.
 On cell free fetal DNA screening, the risks of fetal
 aneuploidies are not increased.  Patient does not have
 gestational diabetes.
 Blood pressure today at her office is 123/57 mmHg.
 On today's ultrasound, amniotic fluid is normal and good fetal
 activity seen.  Fetal growth is appropriate for gestational age.
 Unilateral (left) ventriculomegaly, measuring 10 mm, is seen.
 Rest of the intracranial structures appear normal.  CSP and
 midline appears normal.
 Mild isolated ventriculomegaly
 -I reassured the patient that mild isolated ventriculomegaly is
 associated with good neurodevelopmental outcomes in the
 newborn.
 -It can be associated with fetal Down syndrome or genetic
 conditions.  However, she had low risk for Down syndrome on
 fetal aneuploidy screening.
 -I am not recommending TORCH screening because of low
 likelihood of infection.
Recommendations

 -An appointment was made for her to return in 3 weeks for
 fetal growth assessment and evaluation of lateral ventricles.
 -Neonatal head ultrasound if ventriculomegaly persists on
 follow-up ultrasound.
                 Oxendine, Sorin

## 2023-01-07 IMAGING — US US MFM OB FOLLOW-UP
1 series · 14 of 28 positions shown · non-contrast
Comparison: none

[Series 1: us mfm ob follow-up · 37 acquisitions, 14 frames shown]
[im 2/37]
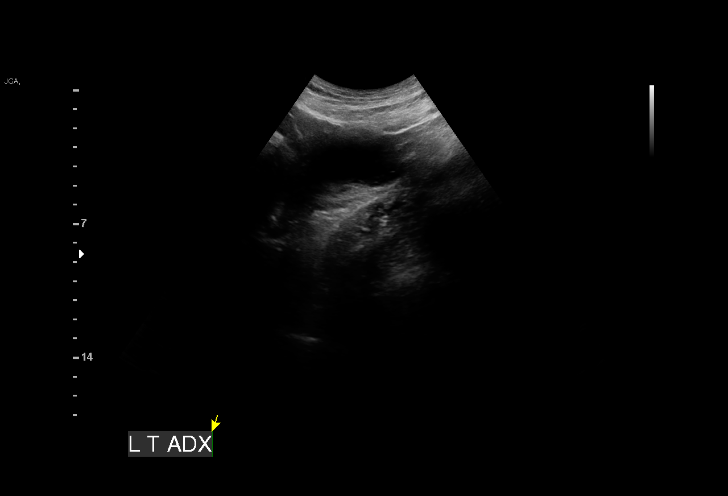
[im 5/37]
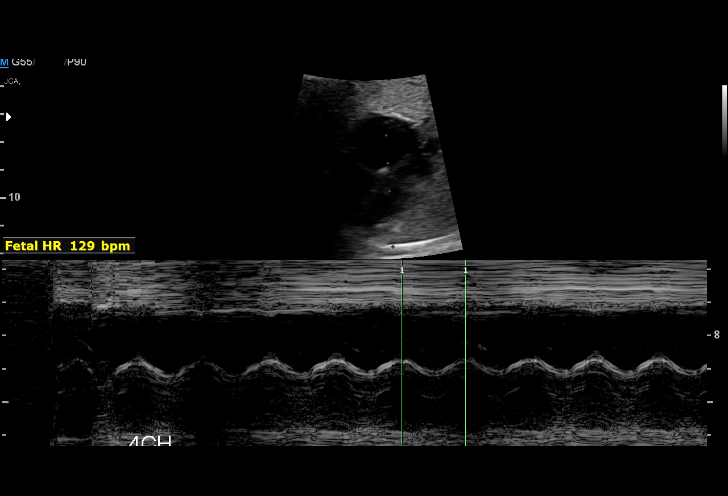
[im 7/37]
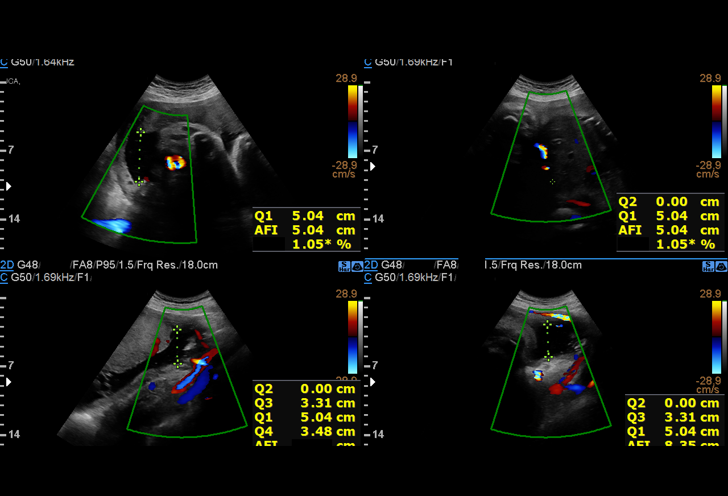
[im 10/37]
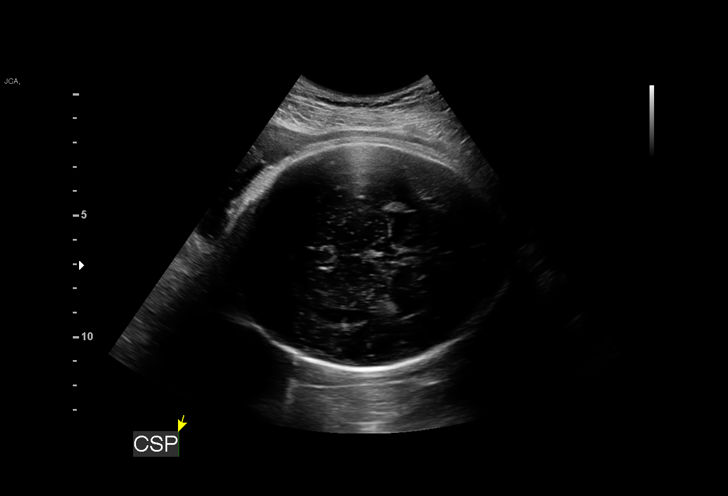
[im 13/37]
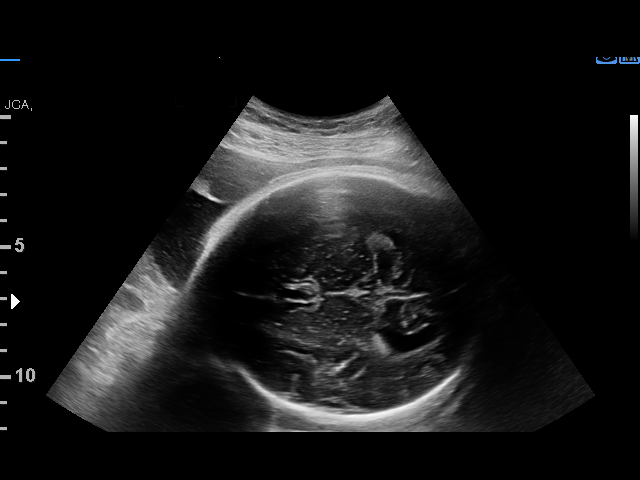
[im 15/37]
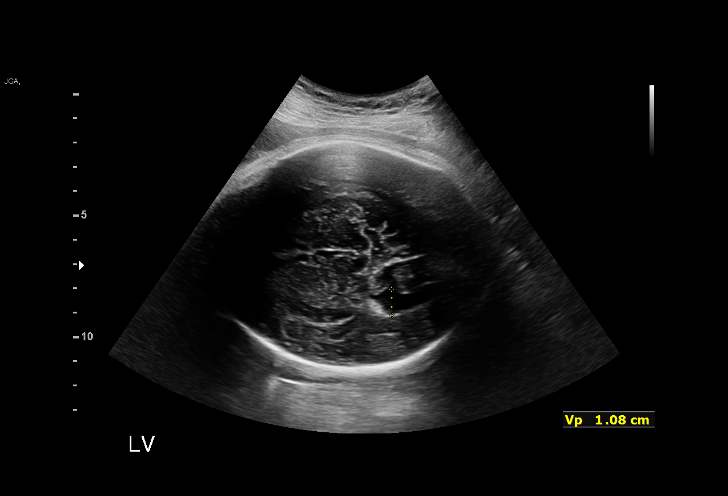
[im 18/37]
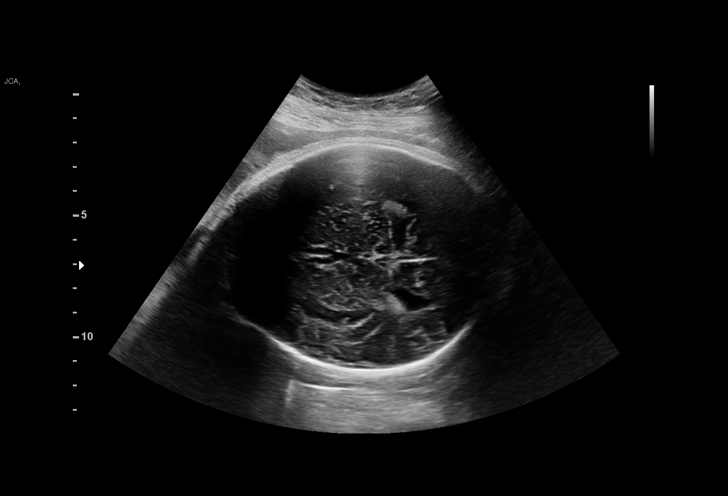
[im 21/37]
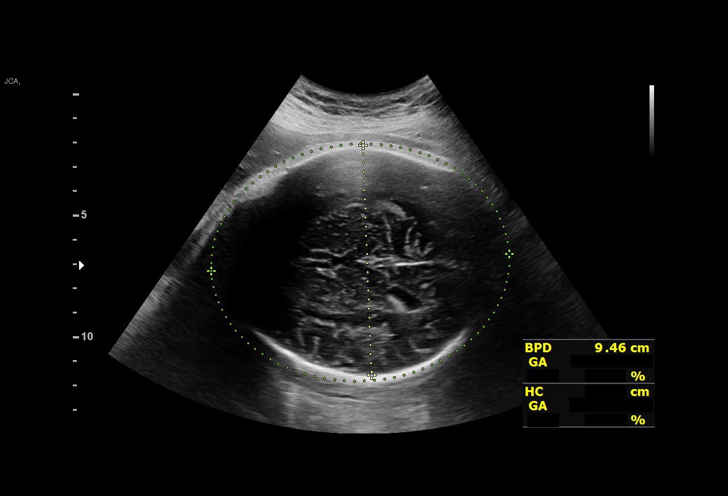
[im 23/37]
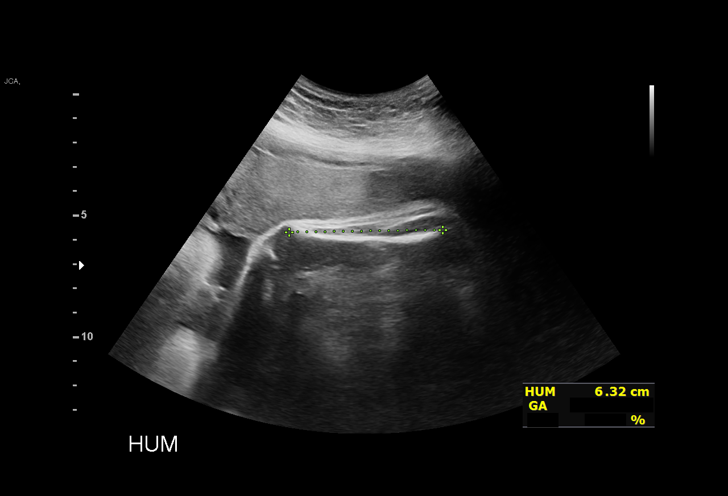
[im 26/37]
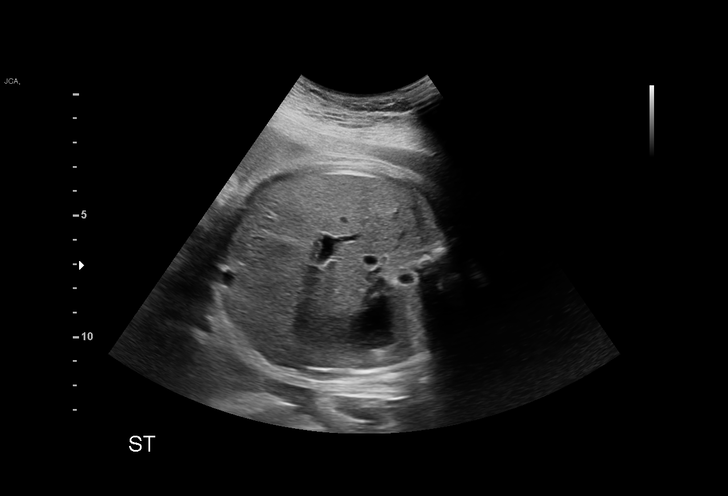
[im 29/37]
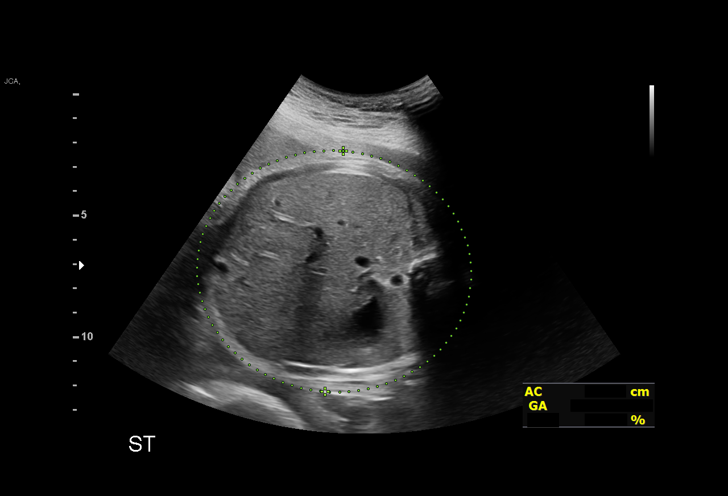
[im 31/37]
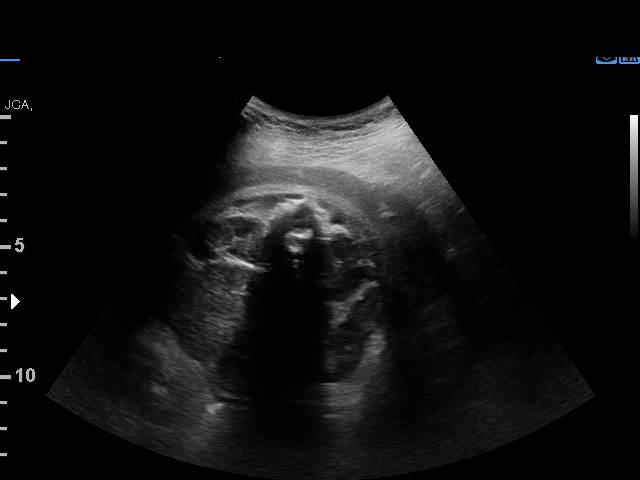
[im 34/37]
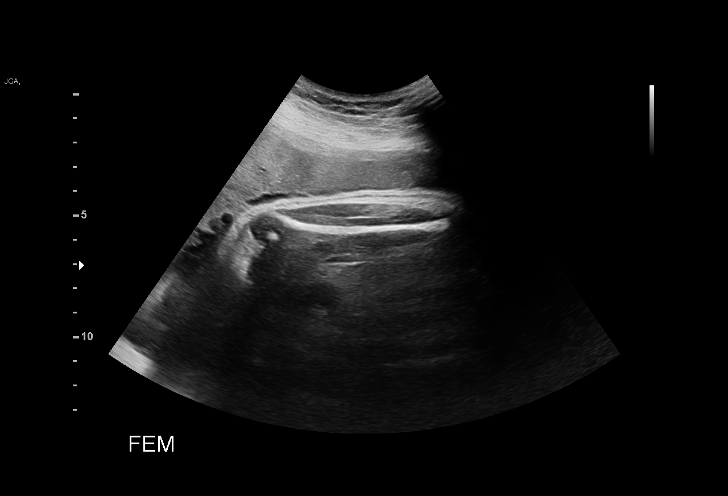
[im 37/37]
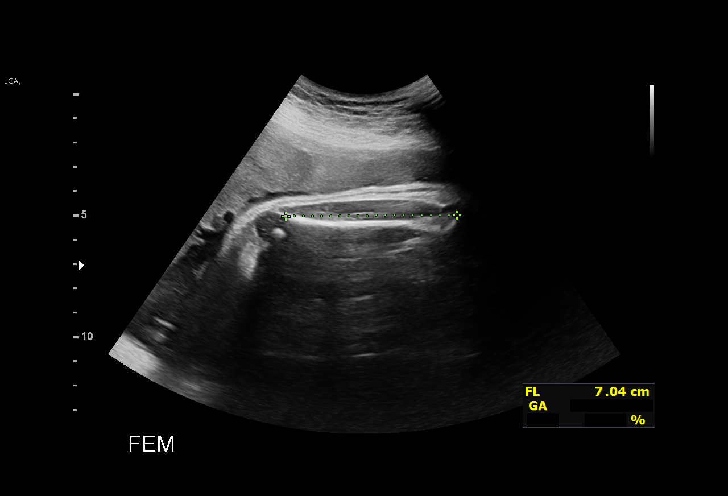

[14 of 28 positions shown; findings below may reference images not displayed]

Indications

 37 weeks gestation of pregnancy
 Hypothyroid (Synthroid)(Levothroxine
 Sodium)
 Low Risk NIPS(Negative Horizon)(Negative
 AFP)
 Cerebral ventriculomegaly
Fetal Evaluation

 Num Of Fetuses:         1
 Preg. Location:         Intrauterine
 Fetal Heart Rate(bpm):  129
 Cardiac Activity:       Observed
 Presentation:           Cephalic
 Placenta:               Anterior

 Amniotic Fluid
 AFI FV:      Within normal limits

 AFI Sum(cm)     %Tile       Largest Pocket(cm)
 11.83           38

 RUQ(cm)       RLQ(cm)       LUQ(cm)        LLQ(cm)
 5.04          3.48          0
Biometry
 BPD:      93.2  mm     G. Age:  37w 6d         82  %    CI:        74.78   %    70 - 86
                                                         FL/HC:      20.8   %    20.8 -
 HC:       342   mm     G. Age:  39w 3d         76  %    HC/AC:      1.03        0.92 -
 AC:      332.8  mm     G. Age:  37w 1d         62  %    FL/BPD:     76.3   %    71 - 87
 FL:       71.1  mm     G. Age:  36w 3d         28  %    FL/AC:      21.4   %    20 - 24
 HUM:      63.2  mm     G. Age:  36w 5d         60  %
 LV:         11  mm

 Est. FW:    3898  gm           7 lb     59  %
OB History

 Gravidity:    3         Term:   0        Prem:   0        SAB:   2
 TOP:          0       Ectopic:  0        Living: 0
Gestational Age

 LMP:           37w 2d        Date:  05/18/20                 EDD:   02/22/21
 U/S Today:     37w 5d                                        EDD:   02/19/21
 Best:          37w 2d     Det. By:  LMP  (05/18/20)          EDD:   02/22/21
Anatomy

 Cranium:               Appears normal         Aortic Arch:            Previously seen
 Cavum:                 Appears normal         Ductal Arch:            Previously seen
 Ventricles:            Lefft                  Diaphragm:              Previously seen
                        Ventriculomegaly
                        (10.8mm)
 Choroid Plexus:        Previously seen        Stomach:                Appears normal, left
                                                                       sided
 Cerebellum:            Previously seen        Abdomen:                Previously seen
 Posterior Fossa:       Previously seen        Abdominal Wall:         Previously seen
 Nuchal Fold:           Previously seen        Cord Vessels:           Previously seen
 Face:                  Orbits and profile     Kidneys:                Appear normal
                        previously seen
 Lips:                  Previously seen        Bladder:                Appears normal
 Thoracic:              Previously seen        Spine:                  Previously seen
 Heart:                 Previously seen        Upper Extremities:      Previously seen
 RVOT:                  Previously seen        Lower Extremities:      Previously seen
 LVOT:                  Previously seen

 Other:  Parents do not wish to know sex of fetus. Heels prev visualized. Open
         hands prev visualized. Technically difficult due to fetal position.
Cervix Uterus Adnexa

 Cervix
 Not visualized (advanced GA >56wks)

 Uterus
 No abnormality visualized.

 Right Ovary
 Not visualized.
 Left Ovary
 Not visualized.

 Cul De Sac
 No free fluid seen.

 Adnexa
 No abnormality visualized.
Impression

 Mild isolated ventriculomegaly.  Patient returned for fetal
 growth assessment.

 Fetal growth is appropriate for gestational age .Amniotic fluid
 is normal and good fetal activity is seen .  Cephalic
 presentation.  Unilateral (left) mild ventriculomegaly,
 measuring 11 mm, is seen.  Intracranial structures, otherwise,
 appear normal.

 I reassured the patient of the stable findings and that patient
 can attempt vaginal delivery.
Recommendations

 -Follow-up scans as clinically indicated.
 -Postnatal head ultrasound of the newborn.
                 Tiger, Abimelk

## 2023-12-12 ENCOUNTER — Telehealth: Payer: Self-pay | Admitting: *Deleted

## 2023-12-12 NOTE — Telephone Encounter (Signed)
 Returned call from 12/11/2023. Left patient a message to call and schedule New OB appointment.

## 2023-12-27 ENCOUNTER — Telehealth: Payer: Self-pay | Admitting: *Deleted

## 2023-12-27 NOTE — Telephone Encounter (Signed)
 Pt called the office stating--having bleeding when vomiting and sore throat  for 3-4 days. Pt is [redacted] weeks pregnant and advised to go to [facility]. Pt agreed and reminded New OB appt 01/04/2024.

## 2024-01-04 ENCOUNTER — Ambulatory Visit: Admitting: Obstetrics and Gynecology

## 2024-01-04 ENCOUNTER — Encounter: Payer: Self-pay | Admitting: Obstetrics and Gynecology

## 2024-01-04 ENCOUNTER — Other Ambulatory Visit (HOSPITAL_COMMUNITY)
Admission: RE | Admit: 2024-01-04 | Discharge: 2024-01-04 | Disposition: A | Discharge status: Home / Self Care | Source: Ambulatory Visit | Attending: Obstetrics and Gynecology | Admitting: Obstetrics and Gynecology

## 2024-01-04 VITALS — BP 120/75 | HR 81 | Ht 67.0 in | Wt 160.0 lb

## 2024-01-04 DIAGNOSIS — E559 Vitamin D deficiency, unspecified: Secondary | ICD-10-CM | POA: Diagnosis not present

## 2024-01-04 DIAGNOSIS — Z1331 Encounter for screening for depression: Secondary | ICD-10-CM | POA: Diagnosis not present

## 2024-01-04 DIAGNOSIS — Z3490 Encounter for supervision of normal pregnancy, unspecified, unspecified trimester: Secondary | ICD-10-CM | POA: Insufficient documentation

## 2024-01-04 DIAGNOSIS — O99281 Endocrine, nutritional and metabolic diseases complicating pregnancy, first trimester: Secondary | ICD-10-CM | POA: Diagnosis not present

## 2024-01-04 DIAGNOSIS — Z8759 Personal history of other complications of pregnancy, childbirth and the puerperium: Secondary | ICD-10-CM

## 2024-01-04 DIAGNOSIS — Z3480 Encounter for supervision of other normal pregnancy, unspecified trimester: Secondary | ICD-10-CM | POA: Insufficient documentation

## 2024-01-04 DIAGNOSIS — Z3A13 13 weeks gestation of pregnancy: Secondary | ICD-10-CM | POA: Diagnosis not present

## 2024-01-04 DIAGNOSIS — Z843 Family history of consanguinity: Secondary | ICD-10-CM | POA: Diagnosis not present

## 2024-01-04 DIAGNOSIS — E039 Hypothyroidism, unspecified: Secondary | ICD-10-CM

## 2024-01-04 LAB — HEPATITIS C ANTIBODY: HCV Ab: NEGATIVE

## 2024-01-04 MED ORDER — DOXYLAMINE-PYRIDOXINE 10-10 MG PO TBEC: DELAYED_RELEASE_TABLET | ORAL | 2 refills | Status: DC

## 2024-01-04 NOTE — Progress Notes (Signed)
 History:   Joan Jackson is a 28 y.o. [id number] at [redacted]w[redacted]d by LMP being seen today for her first obstetrical visit.  Her obstetrical history is significant for retained placenta in 2022. Pregnancy was unplanned but happily welcomed. Patient does intend to breast feed. Pregnancy history fully reviewed.  Patient reports fatigue, nausea, and vomiting.     HISTORY: OB History  Gravida Para Term Preterm AB Living  4 1 1  0 2 1  SAB IAB Ectopic Multiple Live Births  2 0 0 0 1    # Outcome Date GA Lbr Len/2nd Weight Sex Type Anes PTL Lv  4 Current           3 Term 03/01/21 [redacted]w[redacted]d / 03:45 3220 g F Vag-Spont None  LIV     Name: Massar,GIRL Lanice     Apgar1: 9  Apgar5: 9  2 SAB           1 SAB             Obstetric Comments  Pt reported had retained placenta with 2022 pregnancy, visit back to [facility] hospital, u/s confirmed removed.     Last pap smear was done July 2022 and was normal  Past Medical History:  Diagnosis Date   Hyperthyroidism    History reviewed. No pertinent surgical history. Family History  Problem Relation Age of Onset   Diabetes Mother    Hypertension Mother    Anemia Mother    Diabetes Father    Anemia Sister    Social History   Tobacco Use   Smoking status: Never   Smokeless tobacco: Never  Vaping Use   Vaping status: Never Used  Substance Use Topics   Alcohol use: No    Alcohol/week: 0.0 standard drinks of alcohol   Drug use: No   Allergies  Allergen Reactions   Bee Venom Swelling   Current Outpatient Medications on File Prior to Visit  Medication Sig Dispense Refill   Prenatal Vit-Fe Fumarate-FA (PRENATAL PO) Take 1 tablet by mouth daily.     levothyroxine  (SYNTHROID ) 75 MCG tablet Take 75 mcg by mouth See admin instructions. 75mcg daily alternating with 100mcg daily (Patient not taking: Reported on 01/04/2024)     norethindrone  (ORTHO MICRONOR ) 0.35 MG tablet Take 1 tablet (0.35 mg total) by mouth daily. (Patient not taking: Reported on 01/04/2024) 28  tablet 11   No current facility-administered medications on file prior to visit.    Review of Systems Pertinent items noted in HPI and remainder of comprehensive ROS otherwise negative. Physical Exam:   Vitals:   01/04/24 1311  BP: 120/75  Pulse: 81  Weight: 72.6 kg   Fetal Heart Rate (bpm): 158 System: General: well-developed, well-nourished female in no acute distress   Skin: normal coloration and turgor, no rashes   Neurologic: oriented, normal mood   Extremities: normal strength, tone, and muscle mass, ROM of all joints is normal   HEENT PERRLA, extraocular movement intact and sclera clear, anicteric   Mouth/Teeth mucous membranes moist, pharynx normal without lesions and dental hygiene good   Neck supple and no masses   Cardiovascular: regular rate and rhythm   Respiratory:  no respiratory distress, normal breath sounds   Abdomen: soft, non-tender; bowel sounds normal; no masses,  no organomegaly     Assessment:    Pregnancy: [id number] Patient Active Problem List   Diagnosis Date Noted   Encounter for supervision of normal pregnancy, antepartum 01/04/2024   Hypothyroidism 07/30/2020   Vitamin D   deficiency 07/22/2019   Vitamin B12 deficiency 07/22/2019     Plan:    1. Hypothyroidism, unspecified type -had TSH 6/18 and was 3.5. Not currently on Synthroid . Will follow q trimester  2. Vitamin D  deficiency -last checked in October, was 24. Not currently on supplement just PNV. Will recheck levels today - VITAMIN D  25 Hydroxy (Vit-D Deficiency, Fractures)  3. Supervision of other normal pregnancy, antepartum (Primary) -still having nausea/vomiting and doesn't feel promethazine is helpful. Will try Diclegis . Also reviewed dietary strategies  - Hemoglobin A1c - Cervicovaginal ancillary only - Urine Culture - RPR+HBsAg+HCVAb+... - US  MFM OB COMP + 14 WK; Future - ABO AND RH  - PANORAMA PRENATAL TEST - Doxylamine -Pyridoxine  (DICLEGIS ) 10-10 MG TBEC; Take 2 tabs at  bedtime. 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 Patient's brother has two children with a chromosomal abnormality but she isn't sure what it is. Discussed increased risk of reoccurrence in families  - AMB MFM GENETICS REFERRAL  5. History of retained placenta -presented to [facility]U 5 days after delivery in 2022 with retained products protruding from vagina. Removed in [facility]U, no meds or D&C required    Initial labs drawn. Continue prenatal vitamins. Genetic Screening discussed, NIPS: completed last pregnancy, will likely do but wants to talk with family. Discussed placing order and sending her with kit in case. Ultrasound discussed; fetal anatomic survey: ordered. Problem list reviewed and updated. The nature of [facility] with multiple MDs and other Advanced Practice Providers was explained to patient; also emphasized that residents, students are part of our team. Routine obstetric precautions reviewed.   Follow up in 4 weeks or sooner if needed  Elenor Mole, [facility] 01/04/24  [facility] for [employer], [facility]

## 2024-01-05 ENCOUNTER — Ambulatory Visit: Payer: Self-pay | Admitting: Obstetrics and Gynecology

## 2024-01-05 ENCOUNTER — Telehealth: Payer: Self-pay

## 2024-01-05 LAB — ABO AND RH: Rh Factor: POSITIVE

## 2024-01-05 LAB — RPR+HBSAG+HCVAB+...
HIV Screen 4th Generation wRfx: NONREACTIVE
Hep C Virus Ab: NONREACTIVE
Hepatitis B Surface Ag: NEGATIVE
RPR Ser Ql: NONREACTIVE

## 2024-01-05 LAB — CERVICOVAGINAL ANCILLARY ONLY
Chlamydia: NEGATIVE
Comment: NEGATIVE
Comment: NORMAL
Neisseria Gonorrhea: NEGATIVE

## 2024-01-05 LAB — HEMOGLOBIN A1C
Est. average glucose Bld gHb Est-mCnc: 111 mg/dL
Hgb A1c MFr Bld: 5.5 % (ref 4.8–5.6)

## 2024-01-05 LAB — VITAMIN D 25 HYDROXY (VIT D DEFICIENCY, FRACTURES): Vit D, 25-Hydroxy: 27.5 ng/mL — ABNORMAL LOW (ref 30.0–100.0)

## 2024-01-05 NOTE — Telephone Encounter (Signed)
 RN received fax regarding PA needed for Diclegis . RN spoke with pharmacy who reported tried to run as brand and insurance will not cover.   Silvano LELON Piano, RN

## 2024-01-06 LAB — URINE CULTURE: Organism ID, Bacteria: NO GROWTH

## 2024-01-08 ENCOUNTER — Telehealth: Payer: Self-pay

## 2024-01-08 NOTE — Telephone Encounter (Signed)
 RN received instructions from provider to call patient regarding labs and to add Vitamin D . RN left HIPAA compliant voicemail to return RN call.  Silvano LELON Piano, RN

## 2024-01-10 ENCOUNTER — Telehealth: Payer: Self-pay

## 2024-01-10 NOTE — Telephone Encounter (Signed)
 RN spoke with patient and notified of Diclegis  PA denied when trying to send to Cover My Meds. RN discussed how patient could get Unisom  and B6 over the counter. Pt verbalized understanding.  Silvano LELON Piano, RN

## 2024-01-15 NOTE — Addendum Note (Signed)
 Addended by: ORLINDA SILVANO ORN on: 01/15/2024 09:22 AM   Modules accepted: Orders

## 2024-01-18 ENCOUNTER — Telehealth: Payer: Self-pay

## 2024-01-18 LAB — PANORAMA PRENATAL TEST FULL PANEL:PANORAMA TEST PLUS 5 ADDITIONAL MICRODELETIONS

## 2024-01-18 NOTE — Telephone Encounter (Signed)
 RN called patient at request of Dr. Cleatus and notified of results and follow  scheduled with genetics counselor on 7/14 to review further and answer any questions. Pt reported no further questions at this time.  Silvano LELON Piano, RN

## 2024-01-19 ENCOUNTER — Telehealth: Payer: Self-pay

## 2024-01-19 NOTE — Telephone Encounter (Signed)
 Returned pt call and went over Safe medication list for nausea and heartburn.  Delon Maiden, CMA

## 2024-01-22 ENCOUNTER — Ambulatory Visit: Attending: Obstetrics and Gynecology

## 2024-01-22 DIAGNOSIS — O285 Abnormal chromosomal and genetic finding on antenatal screening of mother: Secondary | ICD-10-CM | POA: Insufficient documentation

## 2024-01-22 DIAGNOSIS — Z8489 Family history of other specified conditions: Secondary | ICD-10-CM | POA: Diagnosis not present

## 2024-01-22 DIAGNOSIS — Z843 Family history of consanguinity: Secondary | ICD-10-CM

## 2024-01-22 DIAGNOSIS — Z3A15 15 weeks gestation of pregnancy: Secondary | ICD-10-CM

## 2024-01-22 NOTE — Progress Notes (Addendum)
 [facility] at [facility] [address] Phone:  [phone]   Fax:  [phone]      In-Person Genetic Counseling Clinic Note:   I spoke with 28 y.o. Joan Jackson today to discuss her NIPS results. She was referred by Cleatus Moccasin, MD. She was accompanied by her sister.   Pregnancy History:    [id number]. EGA: [redacted]w[redacted]d by LMP. EDD: 07/09/2024. Davionne has one healthy daughter. She had two early SABs, no known underlying genetic or health concerns. She said it may have been due to a thyroid condition. SABs can occur due to many different reasons including genetic causes. Some individuals are balanced translocation carriers which can increase risk for recurrent miscarriages and infertility. We discussed that chromosomal analysis is available to the patient and FOB. The patient would like to think about this further. Reports she takes PNVs. Denies personal history of diabetes, high blood pressure, thyroid conditions, and seizures. Denies bleeding, infections, and fevers in this pregnancy. Denies using tobacco, alcohol, or street drugs in this pregnancy.   Family History:    A three-generation pedigree was created and scanned into Epic under the Media tab.  Patient reports she and her husband are first cousins. She reports that her brother is married to FOB's sister, and they have two children with an autosomal recessive condition due to the COQ7 gene. This was discovered when their 15 yo niece was toe-walking. She had a cast but still has some trouble walking but has no other symptoms. She was evaluated by [facility] and a speciality clinic in [place]. 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 Of note, some ClinVar submissions  report this variant as pathogenic/likely pathogenic. The patient reports that her niece was diagnosed with primary CoQ10 deficiency due to these results, and she reports that her 8 yo nephew also tested positive. They report that both are being treated with ?CoQ10 supplementation. Their 55 yo niece tested negative. The family was informed that the parents of the affected children are carriers and that there is a 25% recurrence risk.  As we do not have the family members' medical records, risk assessment is limited to patient reports. We reviewed that based on the provided information, we expect that the patient and FOB both have a 1/2 (50%) chance to each be carriers for the same condition. Based on this, we would expect any of their pregnancies to be affected is 1/16 (6%). Due to this, the patient was interested in carrier screening to determine if she and FOB's are also carriers. COQ7 is not included in currently available carrier screening panels. I have contacted [facility] to request test kits and inquire if they would be able to test for the gene. We will contact the patient when the test kits return.   Consanguinity: The couple are first cousins. The inbreeding coefficient is 1/16. Therefore, this couple's children are predicted to be homozygous at ~6% (1/16) of their gene loci. We reviewed general population risk for birth defects is 3%-5%; however, there is an additional 2%-3% increase in risk for first cousin unions in families with no known genetic conditions. We discussed and offered the option of expanded carrier screening for over 600 autosomal recessive or X-linked genetic conditions. In the event that one partner were found  to be a carrier for one or more conditions, carrier screening would be available to the other partner for those conditions. If both partners are carriers for the same condition, there would be a 25% risk to the pregnancy to be affected. We discussed the risks, benefits, and  limitations of carrier screening. We also reviewed the option of diagnostic testing by amniocentesis for single-gene and chromosome conditions. The technical aspects, benefits, risks, and limitations of diagnostic testing including the 1 in 500 risk for miscarriage were reviewed. We also discussed that the couple can decline any additional testing or screening and elect to be followed by routine ultrasounds and postnatal testing if indicated. It was noted that all testing or screening options, including ultrasounds, have limitations and cannot guarantee a healthy pregnancy even if they are normal. The patient declined amniocentesis but is considering carrier screening especially for COQ7.  Maternal ethnicity reported as [place] and paternal ethnicity reported as [place]. Denies Ashkenazi Jewish ancestry.  Family history otherwise not remarkable for consanguinity, individuals with birth defects, intellectual disability, autism spectrum disorder, multiple spontaneous abortions, still births, or unexplained neonatal death.  Atypical Finding on NIPS:  Noninvasive prenatal screening (NIPS) analyzes cell-free DNA originating from the placenta that is found in the maternal blood circulation during pregnancy to provide information regarding the presence or absence of extra DNA for chromosomes 13, 18, and 21 as well as the sex chromosomes. [facility] NIPS result returned as an atypical finding. This finding could not be further characterized, and the origin could not be specified. Possible causes for this finding can include the origin of the finding being both maternal and placental, multiple areas of homozygosity, confined placental mosaicism, and/or technical limitations of the test. The finding can also be a normal variant.   We discussed that this finding may be due to consanguinity in the family history; however, we cannot confirm this without further testing. Due to the limitations of the testing  and the reported result, we also discussed the option of amniocentesis or postnatal evaluation as clinically indicated. Moreover, NIPS through another laboratory that utilizes different technology for aneuploidy screening can be considered with the understanding that it may not provide further information regarding the atypical result reported by [facility]; therefore, results and interpretation may be limited. Ultimately, amniocentesis is recommended for diagnostic testing due to the atypical findings.   Newborn Screening. The [place]  Newborn Screening (NBS) program will screen all newborn babies for cystic fibrosis, spinal muscular atrophy, hemoglobinopathies, and numerous other conditions.  Previous Testing Completed:  Negative carrier screening: Crystel previously completed carrier screening. She screened to not be a carrier for cystic fibrosis (CF), spinal muscular atrophy (SMA), alpha thalassemia, and beta hemoglobinopathies. Please see report for details. A negative result on carrier screening reduces but does not eliminate the chance of being a carrier.    Plan of Care:   [facility] test kits for COQ7 gene have been ordered. We will call the patient when they arrive to coordinate screening for her and FOB. Patient is considering expanded carrier screening. We will revisit this when we discuss COQ7 screening. Declined amniocentesis. Added to [employer].   ADDENDUM 01/29/2024: I called the patient to inform her the [employer] test kits are available for COQ7 screening. She elected to have the screening done during her next [facility] ultrasound on 02/26/2024. She elects to be screened first and if positive, screen her husband.  Informed consent was obtained. All questions were answered.   165 minutes were spent on the  date of the encounter in service to the patient including preparation (15 min), face-to-face consultation (80), discussion of test reports and available next steps, pedigree construction,  genetic risk assessment, documentation (70 min), and care coordination.    Thank you for sharing in the care of Celise with us .  Please do not hesitate to contact us  at [phone] if you have any questions.   Lauraine Bodily, MS, [facility] Certified Genetic Counselor   Genetic counseling student involved in appointment: No.

## 2024-01-25 ENCOUNTER — Other Ambulatory Visit (HOSPITAL_COMMUNITY)
Admission: RE | Admit: 2024-01-25 | Discharge: 2024-01-25 | Disposition: A | Discharge status: Home / Self Care | Source: Ambulatory Visit | Attending: Obstetrics and Gynecology | Admitting: Obstetrics and Gynecology

## 2024-01-25 ENCOUNTER — Encounter: Payer: Self-pay | Admitting: Obstetrics and Gynecology

## 2024-01-25 ENCOUNTER — Ambulatory Visit: Payer: Self-pay | Admitting: Obstetrics and Gynecology

## 2024-01-25 VITALS — BP 112/74 | HR 78 | Wt 164.0 lb

## 2024-01-25 DIAGNOSIS — Z3482 Encounter for supervision of other normal pregnancy, second trimester: Secondary | ICD-10-CM | POA: Diagnosis not present

## 2024-01-25 DIAGNOSIS — O285 Abnormal chromosomal and genetic finding on antenatal screening of mother: Secondary | ICD-10-CM

## 2024-01-25 DIAGNOSIS — E039 Hypothyroidism, unspecified: Secondary | ICD-10-CM

## 2024-01-25 DIAGNOSIS — Z3A16 16 weeks gestation of pregnancy: Secondary | ICD-10-CM | POA: Diagnosis present

## 2024-01-25 DIAGNOSIS — Z3480 Encounter for supervision of other normal pregnancy, unspecified trimester: Secondary | ICD-10-CM

## 2024-01-25 DIAGNOSIS — E559 Vitamin D deficiency, unspecified: Secondary | ICD-10-CM

## 2024-01-25 NOTE — Progress Notes (Signed)
   PRENATAL VISIT NOTE  Subjective:  Joan Jackson is a 27 y.o. G4P1021 at [redacted]w[redacted]d being seen today for ongoing prenatal care.  She is currently monitored for the following issues for this low-risk pregnancy and has Hypothyroidism; Vitamin D  deficiency; Encounter for supervision of normal pregnancy, antepartum; Consanguinity; History of retained placenta; Atypical findings NIPS; and Family history of genetic disease on their problem list.  Patient reports no complaints.  Contractions: Irritability. Vag. Bleeding: None.  Movement: Absent. Denies leaking of fluid.   The following portions of the patient's history were reviewed and updated as appropriate: allergies, current medications, past family history, past medical history, past social history, past surgical history and problem list.   Objective:    Vitals:   01/25/24 1326  BP: 112/74  Pulse: 78  Weight: 164 lb (74.4 kg)    Fetal Status:  Fetal Heart Rate (bpm): 157   Movement: Absent    General: Alert, oriented and cooperative. Patient is in no acute distress.  Skin: Skin is warm and dry. No rash noted.   Cardiovascular: Normal heart rate noted  Respiratory: Normal respiratory effort, no problems with respiration noted  Abdomen: Soft, gravid, appropriate for gestational age.  Pain/Pressure: Absent     Pelvic: Cervical exam deferred        Extremities: Normal range of motion.  Edema: None  Mental Status: Normal mood and affect. Normal behavior. Normal judgment and thought content.   Assessment and Plan:  Pregnancy: G4P1021 at [redacted]w[redacted]d 1. Hypothyroidism, unspecified type (Primary) 6/18 TSH wnl. Recheck today now that out of first trimester.  Consider third trimester level.  She has not been on Synthroid  since birth of her first child.   2. Atypical findings NIPS S/p genetics consult, declines amnio  3. Supervision of other normal pregnancy, antepartum AFP today  Pap today  4. Vitamin D  deficiency 27.2 on recent check. Continue  PNV.   5. Pregnancy with 16 completed weeks gestation   Preterm labor symptoms and general obstetric precautions including but not limited to vaginal bleeding, contractions, leaking of fluid and fetal movement were reviewed in detail with the patient. Please refer to After Visit Summary for other counseling recommendations.   No follow-ups on file.  Future Appointments  Date Time Provider Department Center  02/26/2024  9:15 AM [facility] PROVIDER 1 [facility]  02/26/2024  9:30 AM [facility] US4 [facility]    Vina Solian, MD

## 2024-01-27 LAB — AFP, SERUM, OPEN SPINA BIFIDA
AFP MoM: 1.05
AFP Value: 33.7 ng/mL
Gest. Age on Collection Date: 16.2 wk
Maternal Age At EDD: 30.3 a
OSBR Risk 1 IN: 10000
Test Results:: NEGATIVE
Weight: 164 [lb_av]

## 2024-01-27 LAB — TSH RFX ON ABNORMAL TO FREE T4: TSH: 3.5 u[IU]/mL (ref 0.450–4.500)

## 2024-01-29 ENCOUNTER — Ambulatory Visit: Payer: Self-pay | Admitting: Obstetrics and Gynecology

## 2024-01-29 ENCOUNTER — Telehealth: Payer: Self-pay

## 2024-01-29 ENCOUNTER — Other Ambulatory Visit: Payer: Self-pay

## 2024-01-29 DIAGNOSIS — Z3480 Encounter for supervision of other normal pregnancy, unspecified trimester: Secondary | ICD-10-CM

## 2024-01-29 NOTE — Progress Notes (Signed)
 Per Dr. Cleatus, need for Rubella screen. RN spoke with [facility] with add on test for Rubella screen from previous specimen sample on 01/25/24.  Silvano LELON Piano, RN

## 2024-01-30 LAB — RUBELLA SCREEN: Rubella Antibodies, IGG: 6.89 {index} (ref >0.99)

## 2024-01-30 LAB — SPECIMEN STATUS REPORT

## 2024-02-01 LAB — CYTOLOGY - PAP
Adequacy: ABSENT
Chlamydia: NEGATIVE
Comment: NEGATIVE
Comment: NORMAL
Diagnosis: NEGATIVE
Neisseria Gonorrhea: NEGATIVE

## 2024-02-22 ENCOUNTER — Encounter: Payer: Self-pay | Admitting: Obstetrics and Gynecology

## 2024-02-22 ENCOUNTER — Ambulatory Visit: Admitting: Obstetrics and Gynecology

## 2024-02-22 VITALS — BP 113/74 | HR 86 | Wt 168.0 lb

## 2024-02-22 DIAGNOSIS — O285 Abnormal chromosomal and genetic finding on antenatal screening of mother: Secondary | ICD-10-CM | POA: Diagnosis not present

## 2024-02-22 DIAGNOSIS — Z3A20 20 weeks gestation of pregnancy: Secondary | ICD-10-CM | POA: Diagnosis not present

## 2024-02-22 DIAGNOSIS — Z3480 Encounter for supervision of other normal pregnancy, unspecified trimester: Secondary | ICD-10-CM

## 2024-02-22 NOTE — Progress Notes (Signed)
   PRENATAL VISIT NOTE  Subjective:  Joan Jackson is a 28 y.o. G4P1021 at [redacted]w[redacted]d being seen today for ongoing prenatal care.  She is currently monitored for the following issues for this low-risk pregnancy and has Hypothyroidism; Vitamin D  deficiency; Encounter for supervision of normal pregnancy, antepartum; Consanguinity; History of retained placenta; Atypical findings NIPS; and Family history of genetic disease on their problem list.  Patient reports no complaints.  Contractions: Irritability. Vag. Bleeding: None.  Movement: Present. Denies leaking of fluid.   The following portions of the patient's history were reviewed and updated as appropriate: allergies, current medications, past family history, past medical history, past social history, past surgical history and problem list.   Objective:    Vitals:   02/22/24 1404  BP: 113/74  Pulse: 86  Weight: 168 lb (76.2 kg)    Fetal Status:  Fetal Heart Rate (bpm): 150   Movement: Present    General: Alert, oriented and cooperative. Patient is in no acute distress.  Skin: Skin is warm and dry. No rash noted.   Cardiovascular: Normal heart rate noted  Respiratory: Normal respiratory effort, no problems with respiration noted  Abdomen: Soft, gravid, appropriate for gestational age.  Pain/Pressure: Absent     Pelvic: Cervical exam deferred        Extremities: Normal range of motion.  Edema: None  Mental Status: Normal mood and affect. Normal behavior. Normal judgment and thought content.   Assessment and Plan:  Pregnancy: G4P1021 at [redacted]w[redacted]d 1. Atypical findings NIPS (Primary) Declined amnio. S/p GC.   2. Supervision of other normal pregnancy, antepartum AFP wnl.  Anatomy US  scheduled.   3. [redacted] weeks gestation of pregnancy   Preterm labor symptoms and general obstetric precautions including but not limited to vaginal bleeding, contractions, leaking of fluid and fetal movement were reviewed in detail with the patient. Please refer to  After Visit Summary for other counseling recommendations.   Return in about 4 weeks (around 03/21/2024) for OB VISIT, MD or APP.  Future Appointments  Date Time Provider Department Center  02/26/2024  9:15 AM [facility] PROVIDER 1 [facility]  02/26/2024  9:30 AM [facility] US4 [facility]    Vina Solian, MD

## 2024-02-26 ENCOUNTER — Ambulatory Visit: Attending: Obstetrics and Gynecology

## 2024-02-26 ENCOUNTER — Other Ambulatory Visit

## 2024-02-26 ENCOUNTER — Ambulatory Visit (HOSPITAL_BASED_OUTPATIENT_CLINIC_OR_DEPARTMENT_OTHER): Admitting: Obstetrics and Gynecology

## 2024-02-26 ENCOUNTER — Encounter: Payer: Self-pay | Admitting: Obstetrics and Gynecology

## 2024-02-26 ENCOUNTER — Other Ambulatory Visit: Payer: Self-pay | Admitting: *Deleted

## 2024-02-26 ENCOUNTER — Ambulatory Visit

## 2024-02-26 VITALS — BP 114/59 | HR 78

## 2024-02-26 DIAGNOSIS — Z8279 Family history of other congenital malformations, deformations and chromosomal abnormalities: Secondary | ICD-10-CM | POA: Insufficient documentation

## 2024-02-26 DIAGNOSIS — E039 Hypothyroidism, unspecified: Secondary | ICD-10-CM | POA: Insufficient documentation

## 2024-02-26 DIAGNOSIS — O285 Abnormal chromosomal and genetic finding on antenatal screening of mother: Secondary | ICD-10-CM | POA: Diagnosis not present

## 2024-02-26 DIAGNOSIS — O368390 Maternal care for abnormalities of the fetal heart rate or rhythm, unspecified trimester, not applicable or unspecified: Secondary | ICD-10-CM | POA: Insufficient documentation

## 2024-02-26 DIAGNOSIS — Z3A20 20 weeks gestation of pregnancy: Secondary | ICD-10-CM

## 2024-02-26 DIAGNOSIS — Z8489 Family history of other specified conditions: Secondary | ICD-10-CM

## 2024-02-26 DIAGNOSIS — O99282 Endocrine, nutritional and metabolic diseases complicating pregnancy, second trimester: Secondary | ICD-10-CM | POA: Insufficient documentation

## 2024-02-26 DIAGNOSIS — O289 Unspecified abnormal findings on antenatal screening of mother: Secondary | ICD-10-CM | POA: Diagnosis not present

## 2024-02-26 DIAGNOSIS — Z363 Encounter for antenatal screening for malformations: Secondary | ICD-10-CM | POA: Diagnosis not present

## 2024-02-26 DIAGNOSIS — O368320 Maternal care for abnormalities of the fetal heart rate or rhythm, second trimester, not applicable or unspecified: Secondary | ICD-10-CM | POA: Diagnosis not present

## 2024-02-26 DIAGNOSIS — Z3480 Encounter for supervision of other normal pregnancy, unspecified trimester: Secondary | ICD-10-CM | POA: Insufficient documentation

## 2024-02-26 DIAGNOSIS — Z843 Family history of consanguinity: Secondary | ICD-10-CM | POA: Insufficient documentation

## 2024-02-26 NOTE — Progress Notes (Signed)
 Maternal-Fetal Medicine Consultation Name: Joan Jackson MRN: [id number]  G4 P1021 at 20w 6d gestation. Patient is here for fetal anatomy scan. On cell-free fetal DNA screening, atypical finding was seen and the results could not be reported.  MSAFP screening showed low risk for open-neural tube defects.  Patient had genetic counseling and opted not to have amniocentesis.  Obstetrical history is significant for a term vaginal delivery in 2022 for female infant weighing 7 pounds and 2 ounces at birth.  Ultrasound We performed fetal anatomy scan. No makers of aneuploidies or fetal structural defects are seen. Fetal biometry is consistent with her previously-established dates. Amniotic fluid is normal and good fetal activity is seen.  M-mode showed fetal arrhythmia consistent with premature atrial contractions (PAC).  Patient understands the limitations of ultrasound in detecting fetal anomalies.   I explained the finding of arrhythmia, which is more likely to be a PAC.  I advised the patient to decrease caffeine intake.  Patient reports she drinks tea.  I reassured the patient that PACs are not associated with fatal adverse outcomes. Patient reports she does not have hypothyroidism.  Recent TSH levels are within normal range.  She was taking levothyroxine  in her previous pregnancy.   Recommendations - An appointment was made for her to return in 6 weeks for completion of fetal anatomy.   Consultation including face-to-face (more than 50%) counseling 15 minutes.

## 2024-03-08 NOTE — Telephone Encounter (Signed)
 I spoke with the patient to return the [facility] genetic testing results for the COQ7 gene, as she has a family history of a COQ7 variant. 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 This significantly reduces but does not eliminate the chance of the current and any future pregnancies being affected with this condition. Please see report for details.  Lauraine Bodily, MS, [facility] Certified Genetic Counselor [facility] for [facility] [phone]

## 2024-03-19 ENCOUNTER — Ambulatory Visit: Admitting: Obstetrics and Gynecology

## 2024-03-19 VITALS — BP 102/64 | HR 71 | Wt 174.0 lb

## 2024-03-19 DIAGNOSIS — Z3A24 24 weeks gestation of pregnancy: Secondary | ICD-10-CM | POA: Diagnosis not present

## 2024-03-19 DIAGNOSIS — E039 Hypothyroidism, unspecified: Secondary | ICD-10-CM

## 2024-03-19 DIAGNOSIS — Z8489 Family history of other specified conditions: Secondary | ICD-10-CM

## 2024-03-19 DIAGNOSIS — Z9109 Other allergy status, other than to drugs and biological substances: Secondary | ICD-10-CM

## 2024-03-19 DIAGNOSIS — Z3480 Encounter for supervision of other normal pregnancy, unspecified trimester: Secondary | ICD-10-CM

## 2024-03-19 DIAGNOSIS — O99282 Endocrine, nutritional and metabolic diseases complicating pregnancy, second trimester: Secondary | ICD-10-CM

## 2024-03-19 DIAGNOSIS — E559 Vitamin D deficiency, unspecified: Secondary | ICD-10-CM

## 2024-03-19 DIAGNOSIS — O368321 Maternal care for abnormalities of the fetal heart rate or rhythm, second trimester, fetus 1: Secondary | ICD-10-CM | POA: Diagnosis not present

## 2024-03-19 DIAGNOSIS — Z23 Encounter for immunization: Secondary | ICD-10-CM | POA: Diagnosis not present

## 2024-03-19 DIAGNOSIS — O368390 Maternal care for abnormalities of the fetal heart rate or rhythm, unspecified trimester, not applicable or unspecified: Secondary | ICD-10-CM

## 2024-03-19 MED ORDER — CETIRIZINE HCL 10 MG PO TABS: 10.0000 mg | ORAL_TABLET | Freq: Every day | ORAL | 1 refills | Status: AC

## 2024-03-19 NOTE — Progress Notes (Signed)
   PRENATAL VISIT NOTE  Subjective:  Joan Jackson is a 28 y.o. G4P1021 at [redacted]w[redacted]d being seen today for ongoing prenatal care.  She is currently monitored for the following issues for this low-risk pregnancy and has Hypothyroidism; Vitamin D  deficiency; Encounter for supervision of normal pregnancy, antepartum; Consanguinity; History of retained placenta; Atypical findings NIPS; Family history of genetic disease; and Fetal arrhythmia affecting pregnancy, antepartum on their problem list.  Patient reports feeling tired. Also reports seasonal allergies and occasional reflux symptoms. Contractions: Not present. Vag. Bleeding: None.  Movement: Present. Denies leaking of fluid.   The following portions of the patient's history were reviewed and updated as appropriate: allergies, current medications, past family history, past medical history, past social history, past surgical history and problem list.   Objective:    Vitals:   03/19/24 1306  BP: 102/64  Pulse: 71  Weight: 78.9 kg    Fetal Status:  Fetal Heart Rate (bpm): 138 Fundal Height: 22 cm Movement: Present    General: Alert, oriented and cooperative. Patient is in no acute distress.  Skin: Skin is warm and dry. No rash noted.   Cardiovascular: Normal heart rate noted  Respiratory: Normal respiratory effort, no problems with respiration noted  Abdomen: Soft, gravid, appropriate for gestational age.  Pain/Pressure: Absent     Pelvic: Cervical exam deferred   Extremities: Normal range of motion.  Edema: None  Mental Status: Normal mood and affect. Normal behavior. Normal judgment and thought content.   Assessment and Plan:  Pregnancy: G4P1021 at [redacted]w[redacted]d 1. Hypothyroidism, unspecified type (Primary) - TSH today. Patient not currently taking levothyroxine . Will await TSH results drawn today. May need to restart levothyroxine . - Patient reports occasional reflux symptoms. OTC omeprazole as directed.  2. Supervision of other normal  pregnancy, antepartum - BP WNL; FHR WNL; reports +FM; OB US  scheduled 04/08/2024; OGTT in 4 weeks   3. Vitamin D  deficiency - OTC Vitamin D  800 international units by mouth daily.  4. Fetal arrhythmia affecting pregnancy, antepartum - Encouraged patient to limit caffeine intake.  5. Family history of genetic disease - COQ7 variant not detected (see results in Media tab 03/08/2024)  6. Environmental allergies - Cetirizine  10 mg by mouth at bedtime.  7. Need for influenza vaccination - Influenza vaccine given today.  Preterm labor symptoms and general obstetric precautions including but not limited to vaginal bleeding, contractions, leaking of fluid and fetal movement were reviewed in detail with the patient. Please refer to After Visit Summary for other counseling recommendations.   No follow-ups on file.  Future Appointments  Date Time Provider Department Center  04/08/2024  1:15 PM [facility] PROVIDER 1 [facility]  04/08/2024  1:30 PM [facility] US4 [facility]  04/17/2024  8:50 AM Anyanwu, Gloris LABOR, MD [facility]    Debby CHRISTELLA Borer, RN

## 2024-03-19 NOTE — Patient Instructions (Signed)

## 2024-03-19 NOTE — Progress Notes (Deleted)
   PRENATAL VISIT NOTE  Subjective:  Joan Jackson is a 28 y.o. G4P1021 at [redacted]w[redacted]d being seen today for ongoing prenatal care.  She is currently monitored for the following issues for this {Blank single:19197::high-risk,low-risk} pregnancy and has Hypothyroidism; Vitamin D  deficiency; Encounter for supervision of normal pregnancy, antepartum; Consanguinity; History of retained placenta; Atypical findings NIPS; Family history of genetic disease; and Fetal arrhythmia affecting pregnancy, antepartum on their problem list.  Patient reports {[id number]}.  Contractions: Not present. Vag. Bleeding: None.  Movement: Present. Denies leaking of fluid.   The following portions of the patient's history were reviewed and updated as appropriate: allergies, current medications, past family history, past medical history, past social history, past surgical history and problem list.   Objective:    Vitals:   03/19/24 1306  BP: 102/64  Pulse: 71  Weight: 174 lb (78.9 kg)    Fetal Status:  Fetal Heart Rate (bpm): 138   Movement: Present    General: Alert, oriented and cooperative. Patient is in no acute distress.  Skin: Skin is warm and dry. No rash noted.   Cardiovascular: Normal heart rate noted  Respiratory: Normal respiratory effort, no problems with respiration noted  Abdomen: Soft, gravid, appropriate for gestational age.  Pain/Pressure: Absent     Pelvic: {Blank single:19197::Cervical exam performed in the presence of a chaperone,Cervical exam deferred}        Extremities: Normal range of motion.  Edema: None  Mental Status: Normal mood and affect. Normal behavior. Normal judgment and thought content.   Assessment and Plan:  Pregnancy: [id number] at [redacted]w[redacted]d There are no diagnoses linked to this encounter. {Blank single:19197::Term,Preterm} labor symptoms and general obstetric precautions including but not limited to vaginal bleeding, contractions, leaking of fluid and fetal movement were  reviewed in detail with the patient. Please refer to After Visit Summary for other counseling recommendations.   No follow-ups on file.  Future Appointments  Date Time Provider Department Center  04/08/2024  1:15 PM [facility] PROVIDER 1 [facility]  04/08/2024  1:30 PM [facility] US4 [facility]    Delon Emms, NP.cwh

## 2024-03-20 LAB — TSH: TSH: 3.04 u[IU]/mL (ref 0.450–4.500)

## 2024-04-08 ENCOUNTER — Ambulatory Visit

## 2024-04-08 ENCOUNTER — Ambulatory Visit: Attending: Obstetrics and Gynecology | Admitting: Obstetrics and Gynecology

## 2024-04-08 VITALS — BP 109/56 | HR 76

## 2024-04-08 DIAGNOSIS — Z3A26 26 weeks gestation of pregnancy: Secondary | ICD-10-CM

## 2024-04-08 DIAGNOSIS — O26892 Other specified pregnancy related conditions, second trimester: Secondary | ICD-10-CM

## 2024-04-08 DIAGNOSIS — O289 Unspecified abnormal findings on antenatal screening of mother: Secondary | ICD-10-CM | POA: Diagnosis not present

## 2024-04-08 DIAGNOSIS — O99282 Endocrine, nutritional and metabolic diseases complicating pregnancy, second trimester: Secondary | ICD-10-CM

## 2024-04-08 DIAGNOSIS — O368320 Maternal care for abnormalities of the fetal heart rate or rhythm, second trimester, not applicable or unspecified: Secondary | ICD-10-CM | POA: Diagnosis not present

## 2024-04-08 DIAGNOSIS — O089 Unspecified complication following an ectopic and molar pregnancy: Secondary | ICD-10-CM

## 2024-04-08 DIAGNOSIS — Z8279 Family history of other congenital malformations, deformations and chromosomal abnormalities: Secondary | ICD-10-CM | POA: Diagnosis not present

## 2024-04-08 DIAGNOSIS — E039 Hypothyroidism, unspecified: Secondary | ICD-10-CM | POA: Diagnosis not present

## 2024-04-08 DIAGNOSIS — O368390 Maternal care for abnormalities of the fetal heart rate or rhythm, unspecified trimester, not applicable or unspecified: Secondary | ICD-10-CM

## 2024-04-08 DIAGNOSIS — Z362 Encounter for other antenatal screening follow-up: Secondary | ICD-10-CM | POA: Diagnosis present

## 2024-04-08 DIAGNOSIS — Z843 Family history of consanguinity: Secondary | ICD-10-CM | POA: Insufficient documentation

## 2024-04-08 NOTE — Progress Notes (Signed)
 Maternal-Fetal Medicine Consultation  Name: Joan Jackson  MRN: [id number]  GA: H5E8978 [redacted]w[redacted]d   Patient is here for completion of fetal anatomy. Fetal heart arrhythmia was noted at previous ultrasound. On cell-free fetal DNA screening, atypical finding was seen and the results could not be reported.  Patient had opted not to have amniocentesis.  Ultrasound Normal fetal growth and amniotic fluid. Fetal anatomical survey was completed and appears normal. Fetal heart rate and rhythm appear normal with no evidence of premature atrial contractions. I reassured the patient of normal fetal heart rate and rhythm.  I encouraged her to screen for gestational diabetes. She has no risk factors for a follow-up ultrasound.  Recommendations - Follow-up scans as clinically indicated.     Consultation including face-to-face (more than 50%) counseling 10 minutes.

## 2024-04-17 ENCOUNTER — Ambulatory Visit (INDEPENDENT_AMBULATORY_CARE_PROVIDER_SITE_OTHER): Admitting: Obstetrics & Gynecology

## 2024-04-17 VITALS — BP 111/70 | HR 90 | Wt 178.0 lb

## 2024-04-17 DIAGNOSIS — O99283 Endocrine, nutritional and metabolic diseases complicating pregnancy, third trimester: Secondary | ICD-10-CM | POA: Diagnosis not present

## 2024-04-17 DIAGNOSIS — E039 Hypothyroidism, unspecified: Secondary | ICD-10-CM

## 2024-04-17 DIAGNOSIS — Z3480 Encounter for supervision of other normal pregnancy, unspecified trimester: Secondary | ICD-10-CM

## 2024-04-17 DIAGNOSIS — Z3A28 28 weeks gestation of pregnancy: Secondary | ICD-10-CM

## 2024-04-17 DIAGNOSIS — Z3483 Encounter for supervision of other normal pregnancy, third trimester: Secondary | ICD-10-CM | POA: Diagnosis not present

## 2024-04-17 DIAGNOSIS — E559 Vitamin D deficiency, unspecified: Secondary | ICD-10-CM

## 2024-04-17 DIAGNOSIS — O26893 Other specified pregnancy related conditions, third trimester: Secondary | ICD-10-CM

## 2024-04-17 DIAGNOSIS — R12 Heartburn: Secondary | ICD-10-CM | POA: Diagnosis not present

## 2024-04-17 DIAGNOSIS — Z23 Encounter for immunization: Secondary | ICD-10-CM

## 2024-04-17 MED ORDER — PANTOPRAZOLE SODIUM 20 MG PO TBEC
20.0000 mg | DELAYED_RELEASE_TABLET | Freq: Every day | ORAL | 2 refills | Status: AC
Start: 2024-04-17 — End: ?

## 2024-04-17 NOTE — Patient Instructions (Signed)
TDaP Vaccine Pregnancy Get the Whooping Cough Vaccine While You Are Pregnant (CDC)  It is important for women to get the whooping cough vaccine in the third trimester of each pregnancy. Vaccines are the best way to prevent this disease. There are 2 different whooping cough vaccines. Both vaccines combine protection against whooping cough, tetanus and diphtheria, but they are for different age groups: Tdap: for everyone 11 years or older, including pregnant women  DTaP: for children 2 months through 52 years of age  You need the whooping cough vaccine during each of your pregnancies The recommended time to get the shot is during your 27th through 36th week of pregnancy, preferably during the earlier part of this time period. The Centers for Disease Control and Prevention (CDC) recommends that pregnant women receive the whooping cough vaccine for adolescents and adults (called Tdap vaccine) during the third trimester of each pregnancy. The recommended time to get the shot is during your 27th through 36th week of pregnancy, preferably during the earlier part of this time period. This replaces the original recommendation that pregnant women get the vaccine only if they had not previously received it. The [employer] of [employer]ians and [employer]necologists and the [employer] support this recommendation.  You should get the whooping cough vaccine while pregnant to pass protection to your baby frame support disabled and/or not supported in this browser  Learn why Vernona Rieger decided to get the whooping cough vaccine in her 3rd trimester of pregnancy and how her baby girl was born with some protection against the disease. Also available on YouTube. After receiving the whooping cough vaccine, your body will create protective antibodies (proteins produced by the body to fight off diseases) and pass some of them to your baby before birth. These antibodies provide your baby some short-term  protection against whooping cough in early life. These antibodies can also protect your baby from some of the more serious complications that come along with whooping cough. Your protective antibodies are at their highest about 2 weeks after getting the vaccine, but it takes time to pass them to your baby. So the preferred time to get the whooping cough vaccine is early in your third trimester. The amount of whooping cough antibodies in your body decreases over time. That is why CDC recommends you get a whooping cough vaccine during each pregnancy. Doing so allows each of your babies to get the greatest number of protective antibodies from you. This means each of your babies will get the best protection possible against this disease.  Getting the whooping cough vaccine while pregnant is better than getting the vaccine after you give birth Whooping cough vaccination during pregnancy is ideal so your baby will have short-term protection as soon as he is born. This early protection is important because your baby will not start getting his whooping cough vaccines until he is 2 months old. These first few months of life are when your baby is at greatest risk for catching whooping cough. This is also when he's at greatest risk for having severe, potentially life-threating complications from the infection. To avoid that gap in protection, it is best to get a whooping cough vaccine during pregnancy. You will then pass protection to your baby before he is born. To continue protecting your baby, he should get whooping cough vaccines starting at 2 months old. You may never have gotten the Tdap vaccine before and did not get it during this pregnancy. If so, you should  make sure to get the vaccine immediately after you give birth, before leaving the hospital or birthing center. It will take about 2 weeks before your body develops protection (antibodies) in response to the vaccine. Once you have protection from the vaccine,  you are less likely to give whooping cough to your newborn while caring for him. But remember, your baby will still be at risk for catching whooping cough from others. A recent study looked to see how effective Tdap was at preventing whooping cough in babies whose mothers got the vaccine while pregnant or in the hospital after giving birth. The study found that getting Tdap between 27 through 36 weeks of pregnancy is 85% more effective at preventing whooping cough in babies younger than 2 months old. Blood tests cannot tell if you need a whooping cough vaccine There are no blood tests that can tell you if you have enough antibodies in your body to protect yourself or your baby against whooping cough. Even if you have been sick with whooping cough in the past or previously received the vaccine, you still should get the vaccine during each pregnancy. Breastfeeding may pass some protective antibodies onto your baby By breastfeeding, you may pass some antibodies you have made in response to the vaccine to your baby. When you get a whooping cough vaccine during your pregnancy, you will have antibodies in your breast milk that you can share with your baby as soon as your milk comes in. However, your baby will not get protective antibodies immediately if you wait to get the whooping cough vaccine until after delivering your baby. This is because it takes about 2 weeks for your body to create antibodies. Learn more about the health benefits of breastfeeding.   RSV Vaccination for Pregnant People  CDC recommends two ways to protect babies from getting very sick with Respiratory Syncytial Virus (RSV):  An RSV vaccination given during pregnancy  Pfizer's vaccine Verdis Frederickson) is recommended for use during pregnancy. It is given during RSV season to people who are 32 through [redacted] weeks pregnant.  Or, An RSV immunization given directly to infants and some older babies  Babies born to mothers who get RSV vaccine at  least 2 weeks before delivery will have protection and, in most cases, should not need an RSV immunization later.    When is RSV season?  In most regions of the [place] RSV season starts in the fall and peaks in the winter, but the timing and severity of RSV season can vary from place to place and year to year.   The goal of maternal RSV vaccination is to protect babies from getting very sick with RSV during their first RSV season.  In most of the [place], this means maternal RSV vaccine will be given in September through January.  Who should get the maternal RSV vaccine?  People who are 53 through [redacted] weeks pregnant during September through January should get one dose of maternal RSV vaccine to protect their babies. RSV season can vary around the country.   How is the maternal RSV vaccine administered?  Maternal RSV vaccine is given as a shot into the mother's upper arm. Only a single dose (one shot) of maternal RSV vaccine is recommended.   It is not yet known whether another dose might be needed in later pregnancies.  How well does the maternal RSV vaccine work?  When someone gets RSV vaccine, their body responds by making a protein that protects against  the virus that causes RSV. The process takes about 2 weeks. When a pregnant person gets RSV vaccine, their protective proteins (called antibodies) also pass to their baby. So, babies who are born at least 2 weeks after their mother gets RSV vaccine are protected at birth, when infants are at the highest risk of severe RSV disease.   The vaccine can reduce a baby's risk of being hospitalized from RSV by 57% in the first six months after birth.  What are the possible side effects of the maternal RSV vaccine?  In the clinical trials, the side effects most often reported by pregnant people who received the maternal RSV vaccine were pain at the injection site, headache, muscle pain, and nausea.  Although not  common, a dangerous high blood pressure condition called pre-eclampsia occurred in 1.8% of pregnant people who received the maternal RSV vaccine compared to 1.4% of pregnant people who received a placebo.  The clinical trials identified a small increase in the number of preterm births in vaccinated pregnant people. It is not clear if this is a true safety problem related to RSV vaccine or if this occurred for reasons unrelated to vaccination.  To reduce the potential risk of preterm birth and complications from RSV disease, FDA approved the maternal RSV vaccine for use during weeks 32 through 13 of pregnancy while additional studies are conducted.  FDA is requiring the manufacturer to do additional studies that will look more closely at the potential risk of preterm births and pregnancy-related high blood pressure issues in mothers, including pre-eclampsia.  Severe allergic reactions to vaccines are rare but can happen after any vaccine and can be life-threatening. If you see signs of a severe allergic reaction after vaccination (hives, swelling of the face and throat, difficulty breathing, a fast heartbeat, dizziness, or weakness), seek immediate medical care by calling 911.  As with any medicine or vaccine there is a very remote chance of the vaccine causing other serious injury or death after vaccination.  Adverse events following vaccination should be reported to the Vaccine Adverse Event Reporting System (VAERS), even if it's not clear that the vaccine caused the adverse event. You or your doctor can report an adverse event to [facility] and FDA through VAERS. If you need further assistance reporting to VAERS, please email [email or web address] or call [phone].  If you have any questions about side effects from the maternal RSV vaccine, talk with your healthcare provider.  Do I need a prescription for a maternal RSV vaccine?  Until the vaccine available in the office, you will need a prescription to  take to a local pharmacy that is providing the vaccine.   How do I pay for the maternal RSV vaccine?  Most private health insurance plans cover the maternal RSV vaccine, but there may be a cost to you depending on your plan.  Contact your insurer to find out.  Medicaid Beginning April 10, 2022, most people with coverage from [facility] and [employer] [facility]) will be guaranteed coverage of all vaccines recommended by the Advisory Committee on Immunization Practice at no cost to them.   Source: [facility] for Immunization and Respiratory Diseases

## 2024-04-17 NOTE — Progress Notes (Signed)
 PRENATAL VISIT NOTE  Subjective:  Joan Jackson is a 28 y.o. H5E8978 at [redacted]w[redacted]d being seen today for ongoing prenatal care.  She is currently monitored for the following issues for this low-risk pregnancy and has Hypothyroidism; Vitamin D  deficiency; Encounter for supervision of normal pregnancy, antepartum; Consanguinity; History of retained placenta; Atypical findings NIPS; Family history of genetic disease; and Fetal arrhythmia affecting pregnancy, antepartum on their problem list.  Patient reports heartburn, wants medication to help with this.  Contractions: Not present. Vag. Bleeding: None.  Movement: Present. Denies leaking of fluid.   The following portions of the patient's history were reviewed and updated as appropriate: allergies, current medications, past family history, past medical history, past social history, past surgical history and problem list.   Objective:    Vitals:   04/17/24 0851  BP: 111/70  Pulse: 90  Weight: 178 lb 0.6 oz (80.8 kg)    Fetal Status:  Fetal Heart Rate (bpm): 156   Movement: Present    General: Alert, oriented and cooperative. Patient is in no acute distress.  Skin: Skin is warm and dry. No rash noted.   Cardiovascular: Normal heart rate noted  Respiratory: Normal respiratory effort, no problems with respiration noted  Abdomen: Soft, gravid, appropriate for gestational age.  Pain/Pressure: Present (lower abdominal pain)     Pelvic: Cervical exam deferred        Extremities: Normal range of motion.  Edema: Trace  Mental Status: Normal mood and affect. Normal behavior. Normal judgment and thought content.    US  MFM OB FOLLOW UP Result Date: 04/08/2024 ----------------------------------------------------------------------  OBSTETRICS REPORT                       (Signed Final 04/08/2024 01:53 pm) ---------------------------------------------------------------------- Patient Info  ID #:       [id number]                          D.O.B.:  1996-01-29 (28  yrs)(F)  Name:       Joan Jackson                      Visit Date: 04/08/2024 11:03 am ---------------------------------------------------------------------- Performed By  Attending:        Fredia Fresh MD        Ref. Address:     [address] [place]  Performed By:     Jonette Nap        [facility]  Referred By:      RANEE Lofts ---------------------------------------------------------------------- Orders  #  Description                           Code        Ordered By  1  US  MFM OB FOLLOW UP                   23183.98    FREDIA FRESH ----------------------------------------------------------------------  #  Order #                     Accession #                Episode #  1  [id number]                   [id number]                 [id number] ---------------------------------------------------------------------- Indications  Abnormal finding on antenatal screening        O28.9  Atypical finding on NIPS  Fetal arrhythmia affecting pregnancy,          O36.8390  antepartum  Consanguinity                                  Z84.3  Family history of chromosomal abnormality      Z35.79  (Niece and nephew COQ7 mutation)  Hypothyroidism during pregnancy in second      O99.282, EO3.9  trimester  Neg AFP  [redacted] weeks gestation of pregnancy                Z3A.26  Antenatal follow-up for nonvisualized fetal    Z36.2  anatomy ---------------------------------------------------------------------- Fetal Evaluation  Num Of Fetuses:         1  Fetal Heart Rate(bpm):  150  Cardiac Activity:       Observed  Presentation:           Cephalic  Placenta:               Anterior  P. Cord Insertion:      Previously seen  Amniotic  Fluid  AFI FV:      Within normal limits                              Largest Pocket(cm)                              4.23 ---------------------------------------------------------------------- Biometry  BPD:      67.7  mm     G. Age:  27w 2d         53  %    CI:        73.15   %    70 - 86  FL/HC:      18.9   %    18.6 - 20.4  HC:      251.6  mm     G. Age:  27w 2d         37  %    HC/AC:      1.09        1.05 - 1.21  AC:      231.1  mm     G. Age:  27w 3d         61  %    FL/BPD:     70.2   %    71 - 87  FL:       47.5  mm     G. Age:  25w 6d         12  %    FL/AC:      20.6   %    20 - 24  LV:        8.2  mm  Est. FW:     997  gm      2 lb 3 oz     39  % ---------------------------------------------------------------------- OB History  Gravidity:    4         Term:   1         SAB:   2  Living:       4 ---------------------------------------------------------------------- Gestational Age  LMP:           26w 6d        Date:  10/03/23                  EDD:   07/09/24  U/S Today:     27w 0d                                        EDD:   07/08/24  Best:          26w 6d     Det. By:  LMP  (10/03/23)          EDD:   07/09/24 ---------------------------------------------------------------------- Targeted Anatomy  Central Nervous System  Calvarium/Cranial V.:  Appears normal         Cereb./Vermis:          Previously seen  Cavum:                 Appears normal         Cisterna Magna:         Previously seen  Lateral Ventricles:    Appears normal         Midline Falx:           Previously seen  Choroid Plexus:        Previously seen  Spine  Cervical:              Previously seen        Sacral:                 Previously seen  Thoracic:              Previously seen        Shape/Curvature:        Previously seen  Lumbar:                Previously seen  Head/Neck  Lips:  Previously seen        Profile:                Appears normal  Neck:                   Previously seen        Orbits/Eyes:            Previously seen  Nuchal Fold:           Not applicable         Mandible:               Previously seen  Nasal Bone:            Previously seen        Maxilla:                Previously seen  Thorax  4 Chamber View:        Previously seen        Interventr. Septum:     Previously seen  Cardiac Rhythm:        Normal                 Cardiac Axis:           Previously seen  Cardiac Situs:         Previously seen        Diaphragm:              Appears normal  Rt Outflow Tract:      Previously seen        3 Vessel View:          Previously seen  Lt Outflow Tract:      Previously seen        3 V Trachea View:       Previously seen  Aortic Arch:           Previously seen        IVC:                    Previously seen  Ductal Arch:           Previously seen        Crossing:               Previously seen  SVC:                   Previously seen  Abdomen  Ventral Wall:          Previously seen        Lt Kidney:              Appears normal  Cord Insertion:        Previously seen        Rt Kidney:              Appears normal  Situs:                 Previously seen        Bladder:                Appears normal  Stomach:               Appears normal  Extremities  Lt Humerus:            Previously seen        Lt Femur:  Previously seen  Rt Humerus:            Previously seen        Rt Femur:               Previously seen  Lt Forearm:            Previously seen        Lt Lower Leg:           Previously seen  Rt Forearm:            Previously seen        Rt Lower Leg:           Previously seen  Lt Hand:               Open hand nml          Lt Foot:                Previously seen  Rt Hand:               Previously seen        Rt Foot:                Previously seen  Other  Umbilical Cord:        Previously seen        Genitalia:              Female prev seen  Comment:     Technically difficult due to fetal position.  ---------------------------------------------------------------------- Impression  Patient is here for completion of fetal anatomy.  Fetal heart arrhythmia was noted at previous ultrasound.  On cell-free fetal DNA screening, atypical finding was seen  and the results could not be reported.  Patient had opted not  to have amniocentesis.  Ultrasound  Normal fetal growth and amniotic fluid. Fetal anatomical  survey was completed and appears normal.  Fetal heart rate and rhythm appear normal with no evidence  of premature atrial contractions.  I reassured the patient of normal fetal heart rate and rhythm.  I encouraged her to screen for gestational diabetes.  She has no risk factors for a follow-up ultrasound. ---------------------------------------------------------------------- Recommendations  - Follow-up scans as clinically indicated. ----------------------------------------------------------------------                 Fredia Fresh, MD Electronically Signed Final Report   04/08/2024 01:53 pm ----------------------------------------------------------------------    Assessment and Plan:  Pregnancy: H5E8978 at [redacted]w[redacted]d 1. Heartburn during pregnancy in third trimester Protonix prescribed - pantoprazole (PROTONIX) 20 MG tablet; Take 1 tablet (20 mg total) by mouth daily.  Dispense: 60 tablet; Refill: 2  2. Hypothyroidism affecting pregnancy in third trimester On Synthroid  75 mcg daily - TSH Rfx on Abnormal to Free T4  3. Vitamin D  deficiency - Vitamin D  (25 hydroxy)  4. Need for Tdap vaccination - Tdap vaccine greater than or equal to 7yo IM given today  5. [redacted] weeks gestation of pregnancy 6. Supervision of other normal pregnancy, antepartum (Primary) Third trimester labs today, will follow up results and manage accordingly. Third trimester expectations reviewed and all questions answered. - RPR - HIV Antibody (routine testing w rflx) - CBC - Glucose Tolerance, 2 Hours w/1 Hour Preterm labor  symptoms and general obstetric precautions including but not limited to vaginal bleeding, contractions, leaking of fluid and fetal movement were reviewed in detail with the patient. Please refer to After Visit Summary for other counseling recommendations.   Return in about  2 weeks (around 05/01/2024) for OFFICE OB VISIT (MD or APP).  No future appointments.  Gloris Hugger, MD

## 2024-04-18 ENCOUNTER — Ambulatory Visit: Payer: Self-pay | Admitting: Obstetrics & Gynecology

## 2024-04-18 DIAGNOSIS — Z3480 Encounter for supervision of other normal pregnancy, unspecified trimester: Secondary | ICD-10-CM

## 2024-04-18 LAB — GLUCOSE TOLERANCE, 2 HOURS W/ 1HR
Glucose, 1 hour: 157 mg/dL (ref 70–179)
Glucose, 2 hour: 92 mg/dL (ref 70–152)
Glucose, Fasting: 79 mg/dL (ref 70–91)

## 2024-04-18 LAB — CBC
Hematocrit: 37.9 % (ref 34.0–46.6)
Hemoglobin: 12.2 g/dL (ref 11.1–15.9)
MCH: 28.1 pg (ref 26.6–33.0)
MCHC: 32.2 g/dL (ref 31.5–35.7)
MCV: 87 fL (ref 79–97)
Platelets: 214 x10E3/uL (ref 150–450)
RBC: 4.34 x10E6/uL (ref 3.77–5.28)
RDW: 12.6 % (ref 11.7–15.4)
WBC: 9.5 x10E3/uL (ref 3.4–10.8)

## 2024-04-18 LAB — VITAMIN D 25 HYDROXY (VIT D DEFICIENCY, FRACTURES): Vit D, 25-Hydroxy: 36.4 ng/mL (ref 30.0–100.0)

## 2024-04-18 LAB — TSH RFX ON ABNORMAL TO FREE T4: TSH: 3.66 u[IU]/mL (ref 0.450–4.500)

## 2024-04-18 LAB — HIV ANTIBODY (ROUTINE TESTING W REFLEX): HIV Screen 4th Generation wRfx: NONREACTIVE

## 2024-04-18 LAB — RPR: RPR Ser Ql: NONREACTIVE

## 2024-04-26 ENCOUNTER — Telehealth: Payer: Self-pay

## 2024-04-26 NOTE — Telephone Encounter (Signed)
 CORRECTION: patient has dry cough

## 2024-04-26 NOTE — Telephone Encounter (Signed)
 Patient called office to see if there is any medication that she can take for her dry couch, patient stated that she had a hard time sleeping last night, please advise, thanks.

## 2024-05-02 ENCOUNTER — Other Ambulatory Visit (HOSPITAL_COMMUNITY)
Admission: RE | Admit: 2024-05-02 | Discharge: 2024-05-02 | Disposition: A | Source: Ambulatory Visit | Attending: Obstetrics and Gynecology | Admitting: Obstetrics and Gynecology

## 2024-05-02 ENCOUNTER — Ambulatory Visit (INDEPENDENT_AMBULATORY_CARE_PROVIDER_SITE_OTHER): Admitting: Obstetrics and Gynecology

## 2024-05-02 VITALS — BP 129/78 | HR 91 | Wt 180.0 lb

## 2024-05-02 DIAGNOSIS — Z3A30 30 weeks gestation of pregnancy: Secondary | ICD-10-CM | POA: Diagnosis not present

## 2024-05-02 DIAGNOSIS — O285 Abnormal chromosomal and genetic finding on antenatal screening of mother: Secondary | ICD-10-CM

## 2024-05-02 DIAGNOSIS — N898 Other specified noninflammatory disorders of vagina: Secondary | ICD-10-CM | POA: Diagnosis present

## 2024-05-02 DIAGNOSIS — Z8489 Family history of other specified conditions: Secondary | ICD-10-CM

## 2024-05-02 DIAGNOSIS — E039 Hypothyroidism, unspecified: Secondary | ICD-10-CM | POA: Diagnosis not present

## 2024-05-02 DIAGNOSIS — Z8759 Personal history of other complications of pregnancy, childbirth and the puerperium: Secondary | ICD-10-CM

## 2024-05-02 DIAGNOSIS — Z843 Family history of consanguinity: Secondary | ICD-10-CM

## 2024-05-02 DIAGNOSIS — O368390 Maternal care for abnormalities of the fetal heart rate or rhythm, unspecified trimester, not applicable or unspecified: Secondary | ICD-10-CM

## 2024-05-02 DIAGNOSIS — Z3480 Encounter for supervision of other normal pregnancy, unspecified trimester: Secondary | ICD-10-CM

## 2024-05-02 NOTE — Progress Notes (Signed)
   PRENATAL VISIT NOTE  Subjective:  Joan Jackson is a 28 y.o. H5E8978 at [redacted]w[redacted]d being seen today for ongoing prenatal care.  She is currently monitored for the following issues for this low-risk pregnancy and has Hypothyroidism; Vitamin D  deficiency; Encounter for supervision of normal pregnancy, antepartum; Consanguinity; History of retained placenta; Atypical findings NIPS; Family history of genetic disease; and Fetal arrhythmia - RESOLVED on their problem list.  Patient reports vulvar itching/irritation, small amount of discharge.  Contractions: Not present. Vag. Bleeding: None.  Movement: Present. Denies leaking of fluid.   The following portions of the patient's history were reviewed and updated as appropriate: allergies, current medications, past family history, past medical history, past social history, past surgical history and problem list.   Objective:   Vitals:   05/02/24 1030  BP: 129/78  Pulse: 91  Weight: 180 lb 0.6 oz (81.7 kg)   Fetal Status: Fetal Heart Rate (bpm): 140   Movement: Present     General:  Alert, oriented and cooperative. Patient is in no acute distress.  Skin: Skin is warm and dry. No rash noted.   Cardiovascular: Normal heart rate noted  Respiratory: Normal respiratory effort, no problems with respiration noted  Abdomen: Soft, gravid, appropriate for gestational age.  Pain/Pressure: Present     Pelvic - performed in presence of chaperone. Mild erythema/edema of labia minora  Assessment and Plan:  Pregnancy: G4P1021 at [redacted]w[redacted]d 1. Supervision of other normal pregnancy, antepartum (Primary) 2. [redacted] weeks gestation of pregnancy Discussed RSV vaccine next visit  3. Vaginal irritation Vaseline 1-2x daily to help with irritation Will treat for VVC pending swab results - Cervicovaginal ancillary only( [place])  4. Hypothyroidism, unspecified type No current medications Most recent TSH 3.66 on 10/8  5. Atypical findings NIPS 6. Consanguinity 7. Family  history of genetic disease Atypical findings on NIPS Family history of COQ7 mutation in niece and nephew - pt is not a carrier S/p genetic counseling Declined amnio. Follow serial growth with MFM  8. History of retained placenta Retained POCS ~ 3-4d postpartum manually removed in MAU Requests bedside US  after delivery  9. Fetal arrhythmia affecting pregnancy, antepartum Resolved at US  9/29  Please refer to After Visit Summary for other counseling recommendations.   Future Appointments  Date Time Provider Department Center  05/16/2024 10:30 AM Cleatus Moccasin, MD [facility]  05/28/2024 10:50 AM Rasch, Delon FERNS, NP [facility]  06/11/2024 10:50 AM Rasch, Delon FERNS, NP [facility]  06/18/2024 10:50 AM Rasch, Delon FERNS, NP [facility]  06/25/2024 10:50 AM Rasch, Delon FERNS, NP [facility]  07/01/2024 10:30 AM Erik Kieth BROCKS, MD [facility]    Kieth BROCKS Erik, MD

## 2024-05-03 ENCOUNTER — Ambulatory Visit: Payer: Self-pay | Admitting: Obstetrics and Gynecology

## 2024-05-03 LAB — CERVICOVAGINAL ANCILLARY ONLY
Bacterial Vaginitis (gardnerella): NEGATIVE
Candida Glabrata: POSITIVE — AB
Candida Vaginitis: POSITIVE — AB
Comment: NEGATIVE
Comment: NEGATIVE
Comment: NEGATIVE

## 2024-05-03 MED ORDER — CLOTRIMAZOLE 1 % VA CREA: 1.0000 | TOPICAL_CREAM | Freq: Every day | VAGINAL | 0 refills | Status: DC

## 2024-05-14 NOTE — Progress Notes (Unsigned)
   PRENATAL VISIT NOTE  Subjective:  Joan Jackson is a 28 y.o. H5E8978 at [redacted]w[redacted]d being seen today for ongoing prenatal care.  She is currently monitored for the following issues for this low-risk pregnancy and has Hypothyroidism; Vitamin D  deficiency; Encounter for supervision of normal pregnancy, antepartum; Consanguinity; History of retained placenta; Atypical findings NIPS; Family history of genetic disease; and Fetal arrhythmia - RESOLVED on their problem list.  Patient reports {[id number]}.   .  .   . Denies leaking of fluid.   The following portions of the patient's history were reviewed and updated as appropriate: allergies, current medications, past family history, past medical history, past social history, past surgical history and problem list.   Objective:   There were no vitals filed for this visit.  Fetal Status:           General: Alert, oriented and cooperative. Patient is in no acute distress.  Skin: Skin is warm and dry. No rash noted.   Cardiovascular: Normal heart rate noted  Respiratory: Normal respiratory effort, no problems with respiration noted  Abdomen: Soft, gravid, appropriate for gestational age.        Pelvic: Cervical exam deferred        Extremities: Normal range of motion.     Mental Status: Normal mood and affect. Normal behavior. Normal judgment and thought content.    Assessment and Plan:  Pregnancy: G4P1021 at [redacted]w[redacted]d 1. Supervision of other normal pregnancy, antepartum (Primary) Routine PNC up to date. Offer RSV next appt.   2. Atypical findings NIPS NO additional US  required unless clinically indicated.   3. Pregnancy with 32 completed weeks gestation   Preterm labor symptoms and general obstetric precautions including but not limited to vaginal bleeding, contractions, leaking of fluid and fetal movement were reviewed in detail with the patient. Please refer to After Visit Summary for other counseling recommendations.   No follow-ups on  file.  Future Appointments  Date Time Provider Department Center  05/16/2024 10:30 AM Cleatus Moccasin, MD [facility]  05/28/2024 10:50 AM Rasch, Delon FERNS, NP [facility]  06/11/2024 10:50 AM Rasch, Delon FERNS, NP [facility]  06/18/2024 10:50 AM Rasch, Delon FERNS, NP [facility]  06/25/2024 10:50 AM Rasch, Delon FERNS, NP [facility]  07/01/2024 10:30 AM Erik Kieth BROCKS, MD [facility]    Moccasin Cleatus, MD

## 2024-05-16 ENCOUNTER — Ambulatory Visit (INDEPENDENT_AMBULATORY_CARE_PROVIDER_SITE_OTHER): Admitting: Obstetrics and Gynecology

## 2024-05-16 VITALS — BP 115/69 | HR 82 | Wt 179.1 lb

## 2024-05-16 DIAGNOSIS — Z3A32 32 weeks gestation of pregnancy: Secondary | ICD-10-CM | POA: Diagnosis not present

## 2024-05-16 DIAGNOSIS — B3731 Acute candidiasis of vulva and vagina: Secondary | ICD-10-CM

## 2024-05-16 DIAGNOSIS — Z3480 Encounter for supervision of other normal pregnancy, unspecified trimester: Secondary | ICD-10-CM

## 2024-05-16 DIAGNOSIS — O285 Abnormal chromosomal and genetic finding on antenatal screening of mother: Secondary | ICD-10-CM

## 2024-05-16 MED ORDER — CLOTRIMAZOLE 1 % VA CREA: 1.0000 | TOPICAL_CREAM | Freq: Every day | VAGINAL | 1 refills | Status: AC

## 2024-05-28 ENCOUNTER — Ambulatory Visit (INDEPENDENT_AMBULATORY_CARE_PROVIDER_SITE_OTHER): Admitting: Obstetrics and Gynecology

## 2024-05-28 VITALS — BP 111/72 | HR 78 | Wt 182.0 lb

## 2024-05-28 DIAGNOSIS — Z3480 Encounter for supervision of other normal pregnancy, unspecified trimester: Secondary | ICD-10-CM

## 2024-05-28 DIAGNOSIS — Z3A34 34 weeks gestation of pregnancy: Secondary | ICD-10-CM | POA: Diagnosis not present

## 2024-05-28 NOTE — Progress Notes (Signed)
   PRENATAL VISIT NOTE  Subjective:  Joan Jackson is a 28 y.o. H5E8978 at [redacted]w[redacted]d being seen today for ongoing prenatal care.  She is currently monitored for the following issues for this low-risk pregnancy and has Hypothyroidism; Vitamin D  deficiency; Encounter for supervision of normal pregnancy, antepartum; Consanguinity; History of retained placenta; Atypical findings NIPS; Family history of genetic disease; and Fetal arrhythmia - RESOLVED on their problem list.  Patient reports no complaints.  Contractions: Irritability. Vag. Bleeding: None.  Movement: Present. Denies leaking of fluid.   The following portions of the patient's history were reviewed and updated as appropriate: allergies, current medications, past family history, past medical history, past social history, past surgical history and problem list.   Objective:   Vitals:   05/28/24 1037  BP: 111/72  Pulse: 78  Weight: 182 lb (82.6 kg)    Fetal Status:  Fetal Heart Rate (bpm): 140   Movement: Present    General: Alert, oriented and cooperative. Patient is in no acute distress.  Skin: Skin is warm and dry. No rash noted.   Cardiovascular: Normal heart rate noted  Respiratory: Normal respiratory effort, no problems with respiration noted  Abdomen: Soft, gravid, appropriate for gestational age.  Pain/Pressure: Present     Pelvic: Cervical exam deferred        Extremities: Normal range of motion.  Edema: Trace  Mental Status: Normal mood and affect. Normal behavior. Normal judgment and thought content.      04/17/2024    9:23 AM 01/04/2024    1:36 PM  Depression screen PHQ 2/9  Decreased Interest 1 2  Down, Depressed, Hopeless 0 0  PHQ - 2 Score 1 2  Altered sleeping 1 0  Tired, decreased energy 1 2  Change in appetite 0 0  Feeling bad or failure about yourself  0 0  Trouble concentrating 0 0  Moving slowly or fidgety/restless 0 0  Suicidal thoughts 0 0  PHQ-9 Score 3  4      Data saved with a previous flowsheet  row definition        04/17/2024    9:23 AM 01/04/2024    1:36 PM  GAD 7 : Generalized Anxiety Score  Nervous, Anxious, on Edge 0 0  Control/stop worrying 0 0  Worry too much - different things 0 0  Trouble relaxing 0 0  Restless 0 0  Easily annoyed or irritable 1 0  Afraid - awful might happen 0 0  Total GAD 7 Score 1 0    Assessment and Plan:  Pregnancy: G4P1021 at [redacted]w[redacted]d  1. Supervision of other normal pregnancy, antepartum (Primary)  Doing well, no concerns.  Reports good fetal movement No further MFM US 's indicated.   Preterm labor symptoms and general obstetric precautions including but not limited to vaginal bleeding, contractions, leaking of fluid and fetal movement were reviewed in detail with the patient. Please refer to After Visit Summary for other counseling recommendations.   No follow-ups on file.  Future Appointments  Date Time Provider Department Center  06/11/2024 10:50 AM Briggitte Boline, Delon FERNS, NP [facility]  06/18/2024 10:50 AM Cassie Henkels, Delon FERNS, NP [facility]  06/25/2024 10:50 AM Daquann Merriott, Delon FERNS, NP [facility]  07/01/2024 10:30 AM Erik Kieth BROCKS, MD [facility]    Delon Emms, NP

## 2024-05-30 ENCOUNTER — Encounter: Admitting: Obstetrics and Gynecology

## 2024-06-04 ENCOUNTER — Telehealth: Payer: Self-pay

## 2024-06-04 NOTE — Telephone Encounter (Signed)
 RN received message on voicemail regarding pain in bottom and lower part of stomach x 1 day. Pt reported good fetal movement, denied any leaking or bleeding, irregular contractions/having 2-3 in an hour. Pt reported emptying bladder often and drinking adequate water. Pt reported was able to sleep last night. RN advised to continue to change position to help with discomfort and to continue with drinking water, changing position. RN reviewed signs and symptoms of preterm labor and when to notify provider and or go to MAU.   Silvano LELON Piano, RN

## 2024-06-11 ENCOUNTER — Other Ambulatory Visit (HOSPITAL_COMMUNITY)
Admission: RE | Admit: 2024-06-11 | Discharge: 2024-06-11 | Disposition: A | Discharge status: Home / Self Care | Source: Ambulatory Visit | Attending: Obstetrics and Gynecology | Admitting: Obstetrics and Gynecology

## 2024-06-11 ENCOUNTER — Ambulatory Visit: Admitting: Obstetrics and Gynecology

## 2024-06-11 VITALS — BP 99/62 | HR 85 | Wt 182.1 lb

## 2024-06-11 DIAGNOSIS — Z3480 Encounter for supervision of other normal pregnancy, unspecified trimester: Secondary | ICD-10-CM

## 2024-06-11 DIAGNOSIS — Z3A36 36 weeks gestation of pregnancy: Secondary | ICD-10-CM

## 2024-06-11 DIAGNOSIS — Z9229 Personal history of other drug therapy: Secondary | ICD-10-CM

## 2024-06-11 DIAGNOSIS — Z23 Encounter for immunization: Secondary | ICD-10-CM

## 2024-06-11 DIAGNOSIS — Z3493 Encounter for supervision of normal pregnancy, unspecified, third trimester: Secondary | ICD-10-CM | POA: Insufficient documentation

## 2024-06-11 DIAGNOSIS — E039 Hypothyroidism, unspecified: Secondary | ICD-10-CM | POA: Diagnosis not present

## 2024-06-11 DIAGNOSIS — O285 Abnormal chromosomal and genetic finding on antenatal screening of mother: Secondary | ICD-10-CM

## 2024-06-11 NOTE — Progress Notes (Signed)
 PRENATAL VISIT NOTE  Subjective:  Joan Jackson is a 28 y.o. H5E8978 at [redacted]w[redacted]d being seen today for ongoing prenatal care.  She is currently monitored for the following issues for this low-risk pregnancy and has Hypothyroidism; Vitamin D  deficiency; Encounter for supervision of normal pregnancy, antepartum; Consanguinity; History of retained placenta; Atypical findings NIPS; Family history of genetic disease; and Fetal arrhythmia - RESOLVED on their problem list.  Patient reports no complaints.  Contractions: Irregular (Braxton HIcks). Vag. Bleeding: None.  Movement: Present. Denies leaking of fluid.   The following portions of the patient's history were reviewed and updated as appropriate: allergies, current medications, past family history, past medical history, past social history, past surgical history and problem list.   Objective:   Vitals:   06/11/24 1035  BP: 99/62  Pulse: 85  Weight: 182 lb 1.3 oz (82.6 kg)    Fetal Status:  Fetal Heart Rate (bpm): 142   Movement: Present    General: Alert, oriented and cooperative. Patient is in no acute distress.  Skin: Skin is warm and dry. No rash noted.   Cardiovascular: Normal heart rate noted  Respiratory: Normal respiratory effort, no problems with respiration noted  Abdomen: Soft, gravid, appropriate for gestational age.  Pain/Pressure: Absent     Pelvic: Cervical exam performed in the presence of a chaperone Dilation: Closed Effacement (%): Thick    Extremities: Normal range of motion.  Edema: None  Mental Status: Normal mood and affect. Normal behavior. Normal judgment and thought content.      06/11/2024   10:53 AM 04/17/2024    9:23 AM 01/04/2024    1:36 PM  Depression screen PHQ 2/9  Decreased Interest 2 1 2   Down, Depressed, Hopeless 0 0 0  PHQ - 2 Score 2 1 2   Altered sleeping 0 1 0  Tired, decreased energy 2 1 2   Change in appetite 0 0 0  Feeling bad or failure about yourself  0 0 0  Trouble concentrating 0 0 0   Moving slowly or fidgety/restless 0 0 0  Suicidal thoughts 0 0 0  PHQ-9 Score 4 3  4       Data saved with a previous flowsheet row definition        06/11/2024   10:55 AM 04/17/2024    9:23 AM 01/04/2024    1:36 PM  GAD 7 : Generalized Anxiety Score  Nervous, Anxious, on Edge 0 0 0  Control/stop worrying 0 0 0  Worry too much - different things 0 0 0  Trouble relaxing  0 0  Restless 0 0 0  Easily annoyed or irritable 0 1 0  Afraid - awful might happen 0 0 0  Total GAD 7 Score  1 0    Assessment and Plan:  Pregnancy: G4P1021 at [redacted]w[redacted]d  1. Hypothyroidism, unspecified type (Primary)  TSH has been normal throughout pregnancy.  Last TSH 3.66 (04/2024)  2. Supervision of other normal pregnancy, antepartum  RSV vaccine given today GC and GBS collected.   3. Atypical findings NIPS  Family history of COQ7 mutation in niece and nephew - pt is not a carrier S/p genetic counseling Declined amnio. Follow serial growth with MFM have been normal. No f/u needed.    Preterm labor symptoms and general obstetric precautions including but not limited to vaginal bleeding, contractions, leaking of fluid and fetal movement were reviewed in detail with the patient. Please refer to After Visit Summary for other counseling recommendations.   No follow-ups on  file.  Future Appointments  Date Time Provider Department Center  06/18/2024 10:50 AM Samari Bittinger, Delon FERNS, NP [facility]  06/25/2024 10:50 AM Natiya Seelinger, Delon FERNS, NP [facility]  07/01/2024 10:30 AM Erik Kieth BROCKS, MD [facility]    Delon Emms, NP

## 2024-06-12 LAB — CERVICOVAGINAL ANCILLARY ONLY
Chlamydia: NEGATIVE
Comment: NEGATIVE
Comment: NORMAL
Neisseria Gonorrhea: NEGATIVE

## 2024-06-13 ENCOUNTER — Encounter: Admitting: Obstetrics and Gynecology

## 2024-06-13 LAB — STREP GP B NAA: Strep Gp B NAA: NEGATIVE

## 2024-06-17 ENCOUNTER — Telehealth: Payer: Self-pay

## 2024-06-17 NOTE — Telephone Encounter (Signed)
 RN received Access Nurse notification regarding tightness and pressure that comes and goes, patient was seen at [facility] ED, had baby monitored, everything was okay. RN completed follow up call with patient. Pt reported symptoms has resolved. RN reviewed signs and symptoms of preterm labor and when to notify provider.   Silvano LELON Piano, RN

## 2024-06-18 ENCOUNTER — Ambulatory Visit: Admitting: Obstetrics and Gynecology

## 2024-06-18 VITALS — BP 106/73 | HR 88 | Wt 182.0 lb

## 2024-06-18 DIAGNOSIS — Z3480 Encounter for supervision of other normal pregnancy, unspecified trimester: Secondary | ICD-10-CM

## 2024-06-18 NOTE — Progress Notes (Signed)
   PRENATAL VISIT NOTE  Subjective:  Joan Jackson is a 28 y.o. H5E8978 at [redacted]w[redacted]d being seen today for ongoing prenatal care.  She is currently monitored for the following issues for this low-risk pregnancy and has Hypothyroidism; Vitamin D  deficiency; Encounter for supervision of normal pregnancy, antepartum; Consanguinity; History of retained placenta; Atypical findings NIPS; Family history of genetic disease; and Fetal arrhythmia - RESOLVED on their problem list.  Patient reports no complaints.  Contractions: Not present. Vag. Bleeding: None.  Movement: Present. Denies leaking of fluid.   The following portions of the patient's history were reviewed and updated as appropriate: allergies, current medications, past family history, past medical history, past social history, past surgical history and problem list.   Objective:   Vitals:   06/18/24 1111  BP: 106/73  Pulse: 88  Weight: 182 lb (82.6 kg)    Fetal Status:  Fetal Heart Rate (bpm): 140   Movement: Present    General: Alert, oriented and cooperative. Patient is in no acute distress.  Skin: Skin is warm and dry. No rash noted.   Cardiovascular: Normal heart rate noted  Respiratory: Normal respiratory effort, no problems with respiration noted  Abdomen: Soft, gravid, appropriate for gestational age.  Pain/Pressure: Present     Pelvic: Cervical exam performed in the presence of a chaperone Dilation: 1 Effacement (%): 50 Station: -3  Extremities: Normal range of motion.  Edema: Trace  Mental Status: Normal mood and affect. Normal behavior. Normal judgment and thought content.      06/11/2024   10:53 AM 04/17/2024    9:23 AM 01/04/2024    1:36 PM  Depression screen PHQ 2/9  Decreased Interest 2 1 2   Down, Depressed, Hopeless 0 0 0  PHQ - 2 Score 2 1 2   Altered sleeping 0 1 0  Tired, decreased energy 2 1 2   Change in appetite 0 0 0  Feeling bad or failure about yourself  0 0 0  Trouble concentrating 0 0 0  Moving slowly or  fidgety/restless 0 0 0  Suicidal thoughts 0 0 0  PHQ-9 Score 4 3  4       Data saved with a previous flowsheet row definition        06/11/2024   10:55 AM 04/17/2024    9:23 AM 01/04/2024    1:36 PM  GAD 7 : Generalized Anxiety Score  Nervous, Anxious, on Edge 0 0 0  Control/stop worrying 0 0 0  Worry too much - different things 0 0 0  Trouble relaxing  0 0  Restless 0 0 0  Easily annoyed or irritable 0 1 0  Afraid - awful might happen 0 0 0  Total GAD 7 Score  1 0    Assessment and Plan:  Pregnancy: G4P1021 at [redacted]w[redacted]d  1. Supervision of other normal pregnancy, antepartum (Primary)  Doing well Was seen in the ER recently for contractions GBS negative.   Term labor symptoms and general obstetric precautions including but not limited to vaginal bleeding, contractions, leaking of fluid and fetal movement were reviewed in detail with the patient. Please refer to After Visit Summary for other counseling recommendations.   No follow-ups on file.  Future Appointments  Date Time Provider Department Center  06/25/2024 10:50 AM Fynley Chrystal, Delon FERNS, NP [facility]  07/01/2024 10:30 AM Erik Kieth BROCKS, MD [facility]    Delon Emms, NP

## 2024-06-20 ENCOUNTER — Encounter: Admitting: Obstetrics and Gynecology

## 2024-06-24 ENCOUNTER — Telehealth: Payer: Self-pay

## 2024-06-24 NOTE — Telephone Encounter (Signed)
 RN received Access Nurse notification current BP today 107/61, having some headaches and dizziness. Pt reported very tired. Pt reported today has eaten and beans and tortillas, drinking adequate amounts of water. Pt reported felt better after eating. Pt reported good fetal movement, denied any leaking, bleeding or contractions, some pelvic pressure that comes and goes. RN advised to continue to monitor, continue eating snacks, include protein with meals and increase water intake. RN advised if continues to be evaluated at MAU. Pt verbalized understanding.  Silvano LELON Piano, RN

## 2024-06-25 ENCOUNTER — Ambulatory Visit: Admitting: Obstetrics and Gynecology

## 2024-06-25 VITALS — BP 117/76 | HR 88 | Wt 183.0 lb

## 2024-06-25 DIAGNOSIS — Z3480 Encounter for supervision of other normal pregnancy, unspecified trimester: Secondary | ICD-10-CM

## 2024-06-25 NOTE — Patient Instructions (Signed)
 The [employer]  This circuit takes at least 90 minutes to complete so clear your schedule and make mental preparations so you can relax in your environment. The second step requires a lot of pillows so gather them up before beginning Before starting, you should empty your bladder! Have a nice drink nearby, and make sure it has a straw! If you are having contractions, this circuit should be done through contractions, try not to change positions between steps Before you begin...  "I named this 'circuit' after my friend Joan Jackson, who shared and discussed it with me when I was working with a client whose labor seemed to be stalled out and no longer progressing... This circuit is useful to help get the baby lined up, ideally, in the "Left Occiput Anterior" (LOA) Position, both before labor begins and when some corrections need to be done during labor. Prenatally, this position set can help to rotate a baby. As a natural method of induction, this can help get things going if baby just needed a gentle nudge of position to set things off. To the best of my knowledge, this group of positions will not "hurt" a baby that is already lined up correctly." - Joan Jackson   Step One: Open-knee Chest Stay in this position for 30 minutes, start in cat/cow, then drop your chest as low as you can to the bed or the floor and your bottom as high as you can. Knees should be fairly wide apart, and the angle between the torso/thighs should be wider than 90 degrees. Wiggle around, prop with lots of pillows and use this time to get totally relaxed. This position allows the baby to scoot out of the pelvis a bit and gives them room to rotate, shift their head position, etc. If the pregnant person finds it helpful, careful positioning with a rebozo under the belly, with gentle tension from a support person behind can help maintain this position for the full 30 minutes.  Step [id number] Left Side  Lying Roll to your left side, bringing your top leg as high as possible and keeping your bottom leg straight. Roll forward as much as possible, again using a lot of pillows. Sink into the bed and relax some more. If you fall asleep, that's totally okay and you can stay there! If not, stay here for at least another half an hour. Try and get your top right leg up towards your head and get as rolled over onto your belly as much as possible. If you repeat the circuit during labor, try alternating left and right sides. We know the photo the left is actually right side... just flip the image in your head.  Step Three: Moving and Lunges Lunge, walk stairs facing sideways, 2 at a time, (have a spotter downstairs of you!), take a walk outside with one foot on the curb and the other on the street, sit on a birth ball and hula- anything that's upright and putting your pelvis in open, asymmetrical positions. Spend at least 30 minutes doing this one as well to give your baby a chance to move down. If you are lunging or stair or curb walking, you should lunge/walk/go up stairs in the direction that feels better to you. The key with the lunge is that the toes of the higher leg and mom's belly button should be at right angles. Do not lunge over your knee, that closes the pelvis.     Joan Jackson: Tourist information centre manager - [email or web address] Joan December  Jackson, CD, BDT (DONA), LCCE, FACCE: Supporting Content - [email or web address] Rulon Eisenmenger: Photography - [email or web address] Luther Hearing CD/CDT [facility]): Print and Webmaster - [email or web address] [employer] [email or web address]

## 2024-06-25 NOTE — Progress Notes (Signed)
° °  PRENATAL VISIT NOTE  Subjective:  Joan Jackson is a 28 y.o. H5E8978 at [redacted]w[redacted]d being seen today for ongoing prenatal care.  She is currently monitored for the following issues for this low-risk pregnancy and has Hypothyroidism; Vitamin D  deficiency; Encounter for supervision of normal pregnancy, antepartum; Consanguinity; History of retained placenta; Atypical findings NIPS; Family history of genetic disease; and Fetal arrhythmia - RESOLVED on their problem list.  Patient reports no complaints.  Contractions: Not present. Vag. Bleeding: None.  Movement: Present. Denies leaking of fluid.   The following portions of the patient's history were reviewed and updated as appropriate: allergies, current medications, past family history, past medical history, past social history, past surgical history and problem list.   Objective:   Vitals:   06/25/24 1052  BP: 117/76  Pulse: 88  Weight: 183 lb (83 kg)    Fetal Status:  Fetal Heart Rate (bpm): 141   Movement: Present    General: Alert, oriented and cooperative. Patient is in no acute distress.  Skin: Skin is warm and dry. No rash noted.   Cardiovascular: Normal heart rate noted  Respiratory: Normal respiratory effort, no problems with respiration noted  Abdomen: Soft, gravid, appropriate for gestational age.  Pain/Pressure: Present     Pelvic: Cervical exam performed in the presence of a chaperone        Extremities: Normal range of motion.  Edema: Trace  Mental Status: Normal mood and affect. Normal behavior. Normal judgment and thought content.      06/11/2024   10:53 AM 04/17/2024    9:23 AM 01/04/2024    1:36 PM  Depression screen PHQ 2/9  Decreased Interest 2 1 2   Down, Depressed, Hopeless 0 0 0  PHQ - 2 Score 2 1 2   Altered sleeping 0 1 0  Tired, decreased energy 2 1 2   Change in appetite 0 0 0  Feeling bad or failure about yourself  0 0 0  Trouble concentrating 0 0 0  Moving slowly or fidgety/restless 0 0 0  Suicidal thoughts  0 0 0  PHQ-9 Score 4 3  4       Data saved with a previous flowsheet row definition        06/11/2024   10:55 AM 04/17/2024    9:23 AM 01/04/2024    1:36 PM  GAD 7 : Generalized Anxiety Score  Nervous, Anxious, on Edge 0 0 0  Control/stop worrying 0 0 0  Worry too much - different things 0 0 0  Trouble relaxing  0 0  Restless 0 0 0  Easily annoyed or irritable 0 1 0  Afraid - awful might happen 0 0 0  Total GAD 7 Score  1 0    Assessment and Plan:  Pregnancy: G4P1021 at [redacted]w[redacted]d   1. Supervision of other normal pregnancy, antepartum (Primary)  Cervix unchanged from previous exam.  Labor precautions.  GBS negative  Considering membrane strip at 39 weeks.   Term labor symptoms and general obstetric precautions including but not limited to vaginal bleeding, contractions, leaking of fluid and fetal movement were reviewed in detail with the patient. Please refer to After Visit Summary for other counseling recommendations.   No follow-ups on file.  Future Appointments  Date Time Provider Department Center  07/01/2024 10:30 AM Erik Kieth BROCKS, MD [facility]    Delon Emms, NP

## 2024-06-27 ENCOUNTER — Encounter: Admitting: Obstetrics and Gynecology

## 2024-07-01 ENCOUNTER — Ambulatory Visit: Admitting: Obstetrics and Gynecology

## 2024-07-01 ENCOUNTER — Telehealth: Payer: Self-pay

## 2024-07-01 VITALS — BP 108/73 | HR 76 | Wt 183.0 lb

## 2024-07-01 DIAGNOSIS — Z3A38 38 weeks gestation of pregnancy: Secondary | ICD-10-CM | POA: Diagnosis not present

## 2024-07-01 DIAGNOSIS — Z843 Family history of consanguinity: Secondary | ICD-10-CM | POA: Diagnosis not present

## 2024-07-01 DIAGNOSIS — Z8489 Family history of other specified conditions: Secondary | ICD-10-CM | POA: Diagnosis not present

## 2024-07-01 DIAGNOSIS — O285 Abnormal chromosomal and genetic finding on antenatal screening of mother: Secondary | ICD-10-CM

## 2024-07-01 DIAGNOSIS — E039 Hypothyroidism, unspecified: Secondary | ICD-10-CM | POA: Diagnosis not present

## 2024-07-01 DIAGNOSIS — Z3480 Encounter for supervision of other normal pregnancy, unspecified trimester: Secondary | ICD-10-CM

## 2024-07-01 DIAGNOSIS — Z8759 Personal history of other complications of pregnancy, childbirth and the puerperium: Secondary | ICD-10-CM

## 2024-07-01 NOTE — Progress Notes (Signed)
" ° °  PRENATAL VISIT NOTE  Subjective:  Joan Jackson is a 28 y.o. H5E8978 at [redacted]w[redacted]d being seen today for ongoing prenatal care.  She is currently monitored for the following issues for this high-risk pregnancy and has Hypothyroidism; Vitamin D  deficiency; Encounter for supervision of normal pregnancy, antepartum; Consanguinity; History of retained placenta; Atypical findings NIPS; Family history of genetic disease; and Fetal arrhythmia - RESOLVED on their problem list.  Patient reports no complaints.  Contractions: Not present. Vag. Bleeding: None.  Movement: Present. Denies leaking of fluid.   The following portions of the patient's history were reviewed and updated as appropriate: allergies, current medications, past family history, past medical history, past social history, past surgical history and problem list.   Objective:   Vitals:   07/01/24 1023  BP: 108/73  Pulse: 76  Weight: 183 lb (83 kg)   Fetal Status: Fetal Heart Rate (bpm): 155 Fundal Height: 36 cm Movement: Present  Presentation: Vertex  General:  Alert, oriented and cooperative. Patient is in no acute distress.  Skin: Skin is warm and dry. No rash noted.   Cardiovascular: Normal heart rate noted  Respiratory: Normal respiratory effort, no problems with respiration noted  Abdomen: Soft, gravid, appropriate for gestational age.  Pain/Pressure: Present (Top of belly has pain that comes and goes with positions)     SCE performed w/ chaperone present  Assessment and Plan:  Pregnancy: G4P1021 at [redacted]w[redacted]d 1. Supervision of other normal pregnancy, antepartum (Primary) 2. [redacted] weeks gestation of pregnancy GBS neg pdIOL discussed & ordered for 1/6 Condoms for PP contraception Vertex by US  today  3. Atypical findings NIPS 4. Family history of genetic disease 5. Consanguinity Atypical findings on NIPS Family history of COQ7 mutation in niece and nephew - pt is not a carrier S/p genetic counseling Declined amnio  6.  Hypothyroidism, unspecified type No current medications Most recent TSH 3.66 on 10/8 (third tri)  7. History of retained placenta Retained POCS ~ 3-4d postpartum manually removed in MAU Requests bedside US  after delivery  Please refer to After Visit Summary for other counseling recommendations.   Return in about 1 week (around 07/08/2024) for return OB at 39-40 weeks with NST.  No future appointments.  Kieth JAYSON Carolin, MD  "

## 2024-07-01 NOTE — Telephone Encounter (Signed)
 RN returned patient call regarding induction at 39 weeks. RN reviewed with Dr. Erik with plan to speak at next appointment about induction, currently scheduled for 07/16/24 for post date. RN spoke with patient and discussed this. Pt requested telephone call from provider. RN reported would let provider know.  Silvano LELON Piano, RN

## 2024-07-02 ENCOUNTER — Telehealth: Payer: Self-pay

## 2024-07-02 NOTE — Telephone Encounter (Signed)
 Pt called and spoke with Access Nurse regarding induction. RN returned patient call, patient requesting induction this Saturday. RN reported had spoke with provider/Dr. Erik who reported patient was not eligible for an induction based off of her cervix check. IOL currently scheduled for 07/16/24 for post dates. RN reviewed signs and symptoms of labor and when to go to hospital. Pt verbalized understanding.  Silvano LELON Piano, RN

## 2024-07-08 ENCOUNTER — Encounter: Admitting: Obstetrics & Gynecology

## 2024-07-09 ENCOUNTER — Other Ambulatory Visit: Payer: Self-pay

## 2024-07-09 ENCOUNTER — Telehealth: Payer: Self-pay

## 2024-07-09 ENCOUNTER — Inpatient Hospital Stay (HOSPITAL_COMMUNITY)
Admission: AD | Admit: 2024-07-09 | Discharge: 2024-07-09 | Disposition: A | Discharge status: Home / Self Care | Attending: Obstetrics and Gynecology | Admitting: Obstetrics and Gynecology

## 2024-07-09 ENCOUNTER — Telehealth: Payer: Self-pay | Admitting: *Deleted

## 2024-07-09 ENCOUNTER — Encounter (HOSPITAL_COMMUNITY): Payer: Self-pay | Admitting: Obstetrics and Gynecology

## 2024-07-09 DIAGNOSIS — O479 False labor, unspecified: Secondary | ICD-10-CM

## 2024-07-09 DIAGNOSIS — Z3689 Encounter for other specified antenatal screening: Secondary | ICD-10-CM

## 2024-07-09 DIAGNOSIS — Z3483 Encounter for supervision of other normal pregnancy, third trimester: Secondary | ICD-10-CM | POA: Insufficient documentation

## 2024-07-09 DIAGNOSIS — Z3A40 40 weeks gestation of pregnancy: Secondary | ICD-10-CM | POA: Insufficient documentation

## 2024-07-09 NOTE — MAU Provider Note (Signed)
 Patient was assessed for active labor and managed by nursing staff during this encounter. I have reviewed the chart and agree with the documentation and plan. I have also reviewed the NST for appropriate reactivity.  Fetal Tracing: reactive Baseline: 145 Variability: moderate Accelerations: 15x15  Decelerations: none Toco: irregular q51min  Dilation: 1.5 Effacement (%): 50 Station: Ballotable Exam by:: Nat Mule, RN    Patient stable for discharge home with labor precautions.  Cornell Finder, CNM, MSN, IBCLC Certified Nurse Midwife, [facility] 07/09/2024 at 10:00 AM

## 2024-07-09 NOTE — Telephone Encounter (Signed)
 RN spoke with patient who reported in severe pain, was recently sent home from MAU due to not being in active labor. RN advised if pain continues or worsens to return to MAU. Pt also has appointment tomorrow with Dr. Alger. Pt verbalized understanding of when to return to MAU and or notify provider.   Silvano LELON Piano, RN

## 2024-07-09 NOTE — Telephone Encounter (Signed)
 Patient called the office and stated that she would like an earlier induction date due to pain and to review last MAU visit. Also, patient would like induction prior to sister, who will be her caregiver goes back home. Patient advised that provider(s) would be notified of request. I am sending this to the provider she is seeing tomorrow and past 2 providers for review of request. Thank you for your time.

## 2024-07-09 NOTE — MAU Note (Signed)
 MAU Labor Triage Note:  .Joan Jackson is a 28 y.o. at [redacted]w[redacted]d here in MAU reporting:  Contractions every: Patient has not been timing them.  Onset of ctx: 0630 Pain Score: 6  Pain Location: Abdomen  ROM: denies Vaginal Bleeding: none Last SVE: 1.5cm Labor Pain Management Plan: Planning epidural  GBS: Negative  Fetal Movement: Reports positive FM FHT: Fetal Heart Rate Mode: External Baseline Rate (A): 145 bpm  Vitals:   07/09/24 0918  BP: 110/65  Pulse: 89  Resp: 18  Temp: 98 F (36.7 C)  SpO2: 100%      Lab orders placed from triage: MAU Labor Eval OB Office: Faculty

## 2024-07-10 ENCOUNTER — Encounter: Payer: Self-pay | Admitting: Obstetrics and Gynecology

## 2024-07-10 ENCOUNTER — Ambulatory Visit: Admitting: Obstetrics and Gynecology

## 2024-07-10 VITALS — BP 117/73 | HR 85 | Wt 184.0 lb

## 2024-07-10 DIAGNOSIS — Z3A40 40 weeks gestation of pregnancy: Secondary | ICD-10-CM

## 2024-07-10 DIAGNOSIS — O480 Post-term pregnancy: Secondary | ICD-10-CM | POA: Diagnosis not present

## 2024-07-10 DIAGNOSIS — O285 Abnormal chromosomal and genetic finding on antenatal screening of mother: Secondary | ICD-10-CM | POA: Diagnosis not present

## 2024-07-10 DIAGNOSIS — Z3480 Encounter for supervision of other normal pregnancy, unspecified trimester: Secondary | ICD-10-CM

## 2024-07-10 DIAGNOSIS — E039 Hypothyroidism, unspecified: Secondary | ICD-10-CM

## 2024-07-10 DIAGNOSIS — O99283 Endocrine, nutritional and metabolic diseases complicating pregnancy, third trimester: Secondary | ICD-10-CM | POA: Diagnosis not present

## 2024-07-10 NOTE — Progress Notes (Signed)
 "  PRENATAL VISIT NOTE  Subjective:  Joan Jackson is a 28 y.o. H5E8978 at [redacted]w[redacted]d being seen today for ongoing prenatal care.  She is currently monitored for the following issues for this low-risk pregnancy and has Hypothyroidism; Vitamin D  deficiency; Encounter for supervision of normal pregnancy, antepartum; Consanguinity; History of retained placenta; Atypical findings NIPS; Family history of genetic disease; and Fetal arrhythmia - RESOLVED on their problem list.  Patient reports irregular contractions and pelvic pressure.  Contractions: Irregular. Vag. Bleeding: None.  Movement: Present. Denies leaking of fluid.   The following portions of the patient's history were reviewed and updated as appropriate: allergies, current medications, past family history, past medical history, past social history, past surgical history and problem list.   Objective:   Vitals:   07/10/24 1310  BP: 117/73  Pulse: 85  Weight: 184 lb (83.5 kg)    Fetal Status:  Fetal Heart Rate (bpm): 137 Fundal Height: 39 cm Movement: Present    General: Alert, oriented and cooperative. Patient is in no acute distress.  Skin: Skin is warm and dry. No rash noted.   Cardiovascular: Normal heart rate noted  Respiratory: Normal respiratory effort, no problems with respiration noted  Abdomen: Soft, gravid, appropriate for gestational age.  Pain/Pressure: Present     Pelvic: Cervical exam performed in the presence of a chaperone Dilation: 2 Effacement (%): 50 Station: -3  Extremities: Normal range of motion.  Edema: Trace  Mental Status: Normal mood and affect. Normal behavior. Normal judgment and thought content.      06/11/2024   10:53 AM 04/17/2024    9:23 AM 01/04/2024    1:36 PM  Depression screen PHQ 2/9  Decreased Interest 2 1 2   Down, Depressed, Hopeless 0 0 0  PHQ - 2 Score 2 1 2   Altered sleeping 0 1 0  Tired, decreased energy 2 1 2   Change in appetite 0 0 0  Feeling bad or failure about yourself  0 0 0   Trouble concentrating 0 0 0  Moving slowly or fidgety/restless 0 0 0  Suicidal thoughts 0 0 0  PHQ-9 Score 4 3  4       Data saved with a previous flowsheet row definition        06/11/2024   10:55 AM 04/17/2024    9:23 AM 01/04/2024    1:36 PM  GAD 7 : Generalized Anxiety Score  Nervous, Anxious, on Edge 0 0 0  Control/stop worrying 0 0 0  Worry too much - different things 0 0 0  Trouble relaxing  0 0  Restless 0 0 0  Easily annoyed or irritable 0 1 0  Afraid - awful might happen 0 0 0  Total GAD 7 Score  1 0    Assessment and Plan:  Pregnancy: G4P1021 at [redacted]w[redacted]d 1. Supervision of other normal pregnancy, antepartum (Primary) Patient is doing well Membranes stripped today Patient added to elective IOL list and is aware that she may have to wait. Patient is requesting IOL prior to 1/6 because her sister is here with her until Sunday and will be able to provide child care while she is in the hospital  2. Hypothyroidism, unspecified type Stable without medication  3. Atypical findings NIPS   Term labor symptoms and general obstetric precautions including but not limited to vaginal bleeding, contractions, leaking of fluid and fetal movement were reviewed in detail with the patient. Please refer to After Visit Summary for other counseling recommendations.   Return in  about 6 weeks (around 08/21/2024) for postpartum.  Future Appointments  Date Time Provider Department Center  07/16/2024  7:00 AM [facility] SCHED ROOM [facility] None    Winton Felt, MD  "

## 2024-07-11 NOTE — L&D Delivery Note (Signed)
 OB/GYN Faculty Practice Delivery Note  Adaya Molina is a 29 y.o. H5E8978 s/p SVD at [redacted]w[redacted]d. She was admitted for SOL.   ROM: 1h 66m with clear fluid GBS Status: Negative/-- (12/02 1119)  Labor Progress: Initial SVE: 6/80/-3. She then progressed to complete after AROM.    Delivery Date/Time: 1539, 07/12/24 Delivery:  Called to room and patient was complete and pushing. Head delivered ROA. Nuchal cord: present. Shoulder and body delivered in usual fashion. Infant with spontaneous cry, placed on mother's abdomen, dried and stimulated. Cord clamped x 2 after 1-minute delay, and cut by FOB. Cord blood drawn. Placenta delivered spontaneously with gentle cord traction. Placenta inspected and appeared intact/normal. Fundus firm with massage and Pitocin . Labia, perineum, vagina, and cervix inspected. No laceration.    Placenta: L&D Complications: None Lacerations: None EBL: 54 mL Analgesia: Epidural  Count correct: Yes  Newborn Data: Birth date:07/12/2024 Birth time:3:39 PM Gender:Female Living status:Living Apgars:8 ,9  Weight:        APGAR (1 MIN): 8  APGAR (5 MINS): 9   Vina Solian, MD Attending Obstetrician & Gynecologist, [facility] for [employer], [facility]

## 2024-07-12 ENCOUNTER — Inpatient Hospital Stay (HOSPITAL_COMMUNITY): Admitting: Anesthesiology

## 2024-07-12 ENCOUNTER — Encounter (HOSPITAL_COMMUNITY): Payer: Self-pay | Admitting: Obstetrics and Gynecology

## 2024-07-12 ENCOUNTER — Inpatient Hospital Stay (HOSPITAL_COMMUNITY)
Admission: AD | Admit: 2024-07-12 | Discharge: 2024-07-14 | DRG: 807 | Disposition: A | Discharge status: Home / Self Care | Attending: Family Medicine | Admitting: Family Medicine

## 2024-07-12 ENCOUNTER — Other Ambulatory Visit: Payer: Self-pay

## 2024-07-12 DIAGNOSIS — Z8249 Family history of ischemic heart disease and other diseases of the circulatory system: Secondary | ICD-10-CM | POA: Diagnosis not present

## 2024-07-12 DIAGNOSIS — Z833 Family history of diabetes mellitus: Secondary | ICD-10-CM | POA: Diagnosis not present

## 2024-07-12 DIAGNOSIS — O99284 Endocrine, nutritional and metabolic diseases complicating childbirth: Secondary | ICD-10-CM | POA: Diagnosis not present

## 2024-07-12 DIAGNOSIS — Z3A40 40 weeks gestation of pregnancy: Secondary | ICD-10-CM

## 2024-07-12 DIAGNOSIS — O26893 Other specified pregnancy related conditions, third trimester: Secondary | ICD-10-CM | POA: Diagnosis present

## 2024-07-12 DIAGNOSIS — Z843 Family history of consanguinity: Principal | ICD-10-CM

## 2024-07-12 DIAGNOSIS — O480 Post-term pregnancy: Secondary | ICD-10-CM | POA: Diagnosis not present

## 2024-07-12 DIAGNOSIS — Z3480 Encounter for supervision of other normal pregnancy, unspecified trimester: Secondary | ICD-10-CM

## 2024-07-12 LAB — POCT FERN TEST: POCT Fern Test: NEGATIVE

## 2024-07-12 LAB — CBC
HCT: 36.3 % (ref 36.0–46.0)
Hemoglobin: 11.7 g/dL — ABNORMAL LOW (ref 12.0–15.0)
MCH: 26.9 pg (ref 26.0–34.0)
MCHC: 32.2 g/dL (ref 30.0–36.0)
MCV: 83.4 fL (ref 80.0–100.0)
Platelets: 231 K/uL (ref 150–400)
RBC: 4.35 MIL/uL (ref 3.87–5.11)
RDW: 13.8 % (ref 11.5–15.5)
WBC: 11 K/uL — ABNORMAL HIGH (ref 4.0–10.5)
nRBC: 0 % (ref 0.0–0.2)

## 2024-07-12 LAB — SYPHILIS: RPR W/REFLEX TO RPR TITER AND TREPONEMAL ANTIBODIES, TRADITIONAL SCREENING AND DIAGNOSIS ALGORITHM: RPR Ser Ql: NONREACTIVE

## 2024-07-12 LAB — TYPE AND SCREEN
ABO/RH(D): A POS
Antibody Screen: NEGATIVE

## 2024-07-12 LAB — RUPTURE OF MEMBRANE (ROM)PLUS: Rom Plus: NEGATIVE

## 2024-07-12 MED ORDER — OXYCODONE HCL 5 MG PO TABS: 5.0000 mg | ORAL_TABLET | ORAL | Status: DC | PRN

## 2024-07-12 MED ORDER — COCONUT OIL OIL: 1.0000 | TOPICAL_OIL | Status: DC | PRN

## 2024-07-12 MED ORDER — ONDANSETRON HCL 4 MG PO TABS: 4.0000 mg | ORAL_TABLET | ORAL | Status: DC | PRN

## 2024-07-12 MED ORDER — MISOPROSTOL 25 MCG QUARTER TABLET: 25.0000 ug | ORAL_TABLET | ORAL | Status: DC | PRN

## 2024-07-12 MED ORDER — PRENATAL MULTIVITAMIN CH
1.0000 | ORAL_TABLET | Freq: Every day | ORAL | Status: DC
  Administered 2024-07-13 – 2024-07-14 (×2): 1 via ORAL
  Filled 2024-07-12 (×2): qty 1

## 2024-07-12 MED ORDER — PHENYLEPHRINE 80 MCG/ML (10ML) SYRINGE FOR IV PUSH (FOR BLOOD PRESSURE SUPPORT): 80.0000 ug | PREFILLED_SYRINGE | INTRAVENOUS | Status: DC | PRN

## 2024-07-12 MED ORDER — TETANUS-DIPHTH-ACELL PERTUSSIS 5-2-15.5 LF-MCG/0.5 IM SUSP: 0.5000 mL | Freq: Once | INTRAMUSCULAR | Status: DC

## 2024-07-12 MED ORDER — ACETAMINOPHEN 325 MG PO TABS
650.0000 mg | ORAL_TABLET | ORAL | Status: DC | PRN
  Administered 2024-07-12: 650 mg via ORAL
  Filled 2024-07-12: qty 2

## 2024-07-12 MED ORDER — EPHEDRINE 5 MG/ML INJ: 10.0000 mg | INTRAVENOUS | Status: DC | PRN

## 2024-07-12 MED ORDER — OXYTOCIN BOLUS FROM INFUSION
333.0000 mL | Freq: Once | INTRAVENOUS | Status: AC
  Administered 2024-07-12: 333 mL via INTRAVENOUS

## 2024-07-12 MED ORDER — FENTANYL CITRATE (PF) 100 MCG/2ML IJ SOLN: 50.0000 ug | INTRAMUSCULAR | Status: DC | PRN

## 2024-07-12 MED ORDER — FENTANYL-BUPIVACAINE-NACL 0.5-0.125-0.9 MG/250ML-% EP SOLN
12.0000 mL/h | EPIDURAL | Status: DC | PRN
  Administered 2024-07-12: 12 mL/h via EPIDURAL
  Filled 2024-07-12: qty 250

## 2024-07-12 MED ORDER — BENZOCAINE-MENTHOL 20-0.5 % EX AERO: 1.0000 | INHALATION_SPRAY | CUTANEOUS | Status: DC | PRN

## 2024-07-12 MED ORDER — ONDANSETRON HCL 4 MG/2ML IJ SOLN: 4.0000 mg | Freq: Four times a day (QID) | INTRAMUSCULAR | Status: DC | PRN

## 2024-07-12 MED ORDER — LIDOCAINE HCL (PF) 1 % IJ SOLN: 30.0000 mL | INTRAMUSCULAR | Status: DC | PRN

## 2024-07-12 MED ORDER — DIBUCAINE (PERIANAL) 1 % EX OINT: 1.0000 | TOPICAL_OINTMENT | CUTANEOUS | Status: DC | PRN

## 2024-07-12 MED ORDER — LORATADINE 10 MG PO TABS
10.0000 mg | ORAL_TABLET | Freq: Every day | ORAL | Status: DC
  Administered 2024-07-12 – 2024-07-14 (×3): 10 mg via ORAL
  Filled 2024-07-12 (×3): qty 1

## 2024-07-12 MED ORDER — DIPHENHYDRAMINE HCL 50 MG/ML IJ SOLN: 12.5000 mg | INTRAMUSCULAR | Status: DC | PRN

## 2024-07-12 MED ORDER — ONDANSETRON HCL 4 MG/2ML IJ SOLN: 4.0000 mg | INTRAMUSCULAR | Status: DC | PRN

## 2024-07-12 MED ORDER — OXYCODONE HCL 5 MG PO TABS: 10.0000 mg | ORAL_TABLET | ORAL | Status: DC | PRN

## 2024-07-12 MED ORDER — IBUPROFEN 600 MG PO TABS
600.0000 mg | ORAL_TABLET | Freq: Four times a day (QID) | ORAL | Status: DC
  Administered 2024-07-12 – 2024-07-14 (×8): 600 mg via ORAL
  Filled 2024-07-12 (×8): qty 1

## 2024-07-12 MED ORDER — SODIUM CHLORIDE 0.9% FLUSH: 3.0000 mL | INTRAVENOUS | Status: DC | PRN

## 2024-07-12 MED ORDER — LACTATED RINGERS IV SOLN: 500.0000 mL | Freq: Once | INTRAVENOUS | Status: DC

## 2024-07-12 MED ORDER — SIMETHICONE 80 MG PO CHEW: 80.0000 mg | CHEWABLE_TABLET | ORAL | Status: DC | PRN

## 2024-07-12 MED ORDER — LACTATED RINGERS IV SOLN: 500.0000 mL | INTRAVENOUS | Status: DC | PRN

## 2024-07-12 MED ORDER — LIDOCAINE-EPINEPHRINE (PF) 2 %-1:200000 IJ SOLN
INTRAMUSCULAR | Status: DC | PRN
  Administered 2024-07-12: 3 mL via EPIDURAL

## 2024-07-12 MED ORDER — DIPHENHYDRAMINE HCL 25 MG PO CAPS: 25.0000 mg | ORAL_CAPSULE | Freq: Four times a day (QID) | ORAL | Status: DC | PRN

## 2024-07-12 MED ORDER — LACTATED RINGERS IV SOLN
500.0000 mL | Freq: Once | INTRAVENOUS | Status: AC
  Administered 2024-07-12: 500 mL via INTRAVENOUS

## 2024-07-12 MED ORDER — SODIUM CHLORIDE 0.9 % IV SOLN: 250.0000 mL | INTRAVENOUS | Status: DC | PRN

## 2024-07-12 MED ORDER — SODIUM CHLORIDE 0.9% FLUSH: 3.0000 mL | Freq: Two times a day (BID) | INTRAVENOUS | Status: DC

## 2024-07-12 MED ORDER — TERBUTALINE SULFATE 1 MG/ML IJ SOLN: 0.2500 mg | Freq: Once | INTRAMUSCULAR | Status: DC | PRN

## 2024-07-12 MED ORDER — PANTOPRAZOLE SODIUM 20 MG PO TBEC
20.0000 mg | DELAYED_RELEASE_TABLET | Freq: Every day | ORAL | Status: DC
  Administered 2024-07-12 – 2024-07-14 (×3): 20 mg via ORAL
  Filled 2024-07-12 (×3): qty 1

## 2024-07-12 MED ORDER — WITCH HAZEL-GLYCERIN EX PADS: 1.0000 | MEDICATED_PAD | CUTANEOUS | Status: DC | PRN

## 2024-07-12 MED ORDER — FENTANYL-BUPIVACAINE-NACL 0.5-0.125-0.9 MG/250ML-% EP SOLN: 12.0000 mL/h | EPIDURAL | Status: DC | PRN

## 2024-07-12 MED ORDER — OXYTOCIN-SODIUM CHLORIDE 30-0.9 UT/500ML-% IV SOLN
2.5000 [IU]/h | INTRAVENOUS | Status: DC
  Filled 2024-07-12: qty 500

## 2024-07-12 MED ORDER — ACETAMINOPHEN 325 MG PO TABS: 650.0000 mg | ORAL_TABLET | ORAL | Status: DC | PRN

## 2024-07-12 MED ORDER — SOD CITRATE-CITRIC ACID 500-334 MG/5ML PO SOLN: 30.0000 mL | ORAL | Status: DC | PRN

## 2024-07-12 MED ORDER — LACTATED RINGERS IV SOLN: INTRAVENOUS | Status: AC

## 2024-07-12 MED ORDER — SENNOSIDES-DOCUSATE SODIUM 8.6-50 MG PO TABS
2.0000 | ORAL_TABLET | Freq: Every day | ORAL | Status: DC
  Administered 2024-07-13 – 2024-07-14 (×2): 2 via ORAL
  Filled 2024-07-12 (×2): qty 2

## 2024-07-12 MED ORDER — ZOLPIDEM TARTRATE 5 MG PO TABS: 5.0000 mg | ORAL_TABLET | Freq: Every evening | ORAL | Status: DC | PRN

## 2024-07-12 NOTE — H&P (Signed)
 OBSTETRIC ADMISSION HISTORY AND PHYSICAL  Joan Jackson is a 29 y.o. female [phone] with IUP at [redacted]w[redacted]d by LMP/8 presenting for SOL. She reports +FMs, No LOF, no VB, no blurry vision, headaches or peripheral edema, and RUQ pain.  She plans on breast feeding. She request condoms for birth control.  She received her prenatal care at [facility]   Dating: By LMP/ 8 week US  --->  Estimated Date of Delivery: 07/09/24  Sono:    @[redacted]w[redacted]d , CWD, normal anatomy, cephalic presentation,  997g, 60% EFW--> no anomalies seen on scan   Prenatal History/Complications:  Patient Active Problem List   Diagnosis Date Noted   Labor and delivery, indication for care 07/12/2024   Fetal arrhythmia - RESOLVED 02/26/2024   Atypical findings NIPS 01/22/2024   Family history of genetic disease 01/22/2024   Encounter for supervision of normal pregnancy, antepartum 01/04/2024   Consanguinity 01/04/2024   History of retained placenta 01/04/2024   Hypothyroidism 07/30/2020   Vitamin D  deficiency 07/22/2019     Past Medical History: Past Medical History:  Diagnosis Date   Family history of genetic disease 01/22/2024   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 Patient and husband each have a 1/2 chance of being carriers.   Hyperthyroidism     Past Surgical History: History reviewed. No pertinent surgical history.  Obstetrical History: OB History     Gravida  4   Para  1   Term  1   Preterm  0   AB  2   Living  1      SAB  2   IAB  0   Ectopic  0   Multiple  0   Live Births  1        Obstetric Comments  Pt reported had retained placenta with 2022 pregnancy, visit back to [facility], u/s confirmed removed.          Social History Social History   Socioeconomic History   Marital status: Married    Spouse name: Not on file   Number of children: Not on file   Years of education: Not on file   Highest education level: Not on file  Occupational History   Occupation: Homemaker   Tobacco Use   Smoking status: Never   Smokeless tobacco: Never  Vaping Use   Vaping status: Never Used  Substance and Sexual Activity   Alcohol use: No    Alcohol/week: 0.0 standard drinks of alcohol   Drug use: Never   Sexual activity: Yes    Partners: Male    Birth control/protection: None  Other Topics Concern   Not on file  Social History Narrative   Not on file   Social Drivers of Health   Tobacco Use: Low Risk (07/12/2024)   Patient History    Smoking Tobacco Use: Never    Smokeless Tobacco Use: Never    Passive Exposure: Not on file  Financial Resource Strain: Not on file  Food Insecurity: No Food Insecurity (07/12/2024)   Epic    Worried About Programme Researcher, Broadcasting/film/video in the Last Year: Never true    Ran Out of Food in the Last Year: Never true  Transportation Needs: No Transportation Needs (07/12/2024)   Epic    Lack of Transportation (Medical): No    Lack of Transportation (Non-Medical): No  Physical Activity: Not on file  Stress: Not on file  Social Connections: Not on file  Depression (PHQ2-9): Low Risk (06/11/2024)   Depression (PHQ2-9)  PHQ-2 Score: 4  Alcohol Screen: Not on file  Housing: Unknown (07/12/2024)   Epic    Unable to Pay for Housing in the Last Year: No    Number of Times Moved in the Last Year: Not on file    Homeless in the Last Year: No  Utilities: Not At Risk (07/12/2024)   Epic    Threatened with loss of utilities: No  Health Literacy: Not on file    Family History: Family History  Problem Relation Age of Onset   Diabetes Mother    Hypertension Mother    Anemia Mother    Diabetes Father    Anemia Sister     Allergies: Allergies[1]  Medications Prior to Admission  Medication Sig Dispense Refill Last Dose/Taking   Prenatal Vit-Fe Fumarate-FA (PRENATAL PO) Take 1 tablet by mouth daily.   Past Week   cetirizine  (ZYRTEC  ALLERGY) 10 MG tablet Take 1 tablet (10 mg total) by mouth at bedtime. 34 tablet 1 More than a month   pantoprazole   (PROTONIX ) 20 MG tablet Take 1 tablet (20 mg total) by mouth daily. 60 tablet 2 More than a month     Review of Systems   All systems reviewed and negative except as stated in HPI  Blood pressure 138/81, pulse 97, temperature 98.4 F (36.9 C), temperature source Oral, resp. rate 18, last menstrual period 10/03/2023, SpO2 100%, currently breastfeeding. General appearance: alert and cooperative Lungs: clear to auscultation bilaterally Heart: regular rate and rhythm Abdomen: soft, non-tender; bowel sounds normal Pelvic: adequate Extremities: Homans sign is negative, no sign of DVT DTR's WNL Presentation: cephalic Fetal monitoringBaseline: 145 bpm, Variability: Good {> 6 bpm), Accelerations: Reactive, and Decelerations: Absent Uterine activity every 5 minutes Dilation: 6 Effacement (%): 80 Station: -3 Exam by:: FREDRIK Dalton NP   Prenatal labs: ABO, Rh: A/Positive/-- (06/26 1457) Antibody:   Rubella: 6.89 (07/17 1401) RPR: Non Reactive (10/08 0938)  HBsAg: Negative (06/26 1457)  HIV: Non Reactive (10/08 0938)  GBS: Negative/-- (12/02 1119)    Lab Results  Component Value Date   GBS Negative 06/11/2024   GTT = passed Genetic screening  LR Anatomy US  WNL  Immunization History  Administered Date(s) Administered    sv, Bivalent, Protein Subunit Rsvpref,pf (Abrysvo) 06/11/2024   Influenza, Seasonal, Injecte, Preservative Fre 03/19/2024   Influenza,inj,Quad PF,6+ Mos 04/21/2021   PFIZER Comirnaty(Gray Top)Covid-19 Tri-Sucrose Vaccine 01/01/2020, 01/22/2020   Tdap 12/14/2020, 04/17/2024    Prenatal Transfer Tool  Maternal Diabetes: No Genetic Screening: Abnormal:  Results: Other: Atypical sex finding, family history of COQ7 mutation-- Karman Biswell is negative for COQ7 mutation. Husband has not been tested Maternal Ultrasounds/Referrals: Normal Fetal Ultrasounds or other Referrals:  None Maternal Substance Abuse:  No Significant Maternal Medications:  Meds include:  Syntroid Significant Maternal Lab Results: Group B Strep negative Number of Prenatal Visits:greater than 3 verified prenatal visits Maternal Vaccinations:RSV: Given during pregnancy >/=14 days ago, TDap, and Flu Other Comments:  Consanguinity with FOB, typical sex findings on NIP   Results for orders placed or performed during the hospital encounter of 07/12/24 (from the past 24 hours)  POCT fern test   Collection Time: 07/12/24  6:57 AM  Result Value Ref Range   POCT Fern Test Negative = intact amniotic membranes   Rupture of Membrane (ROM) Plus   Collection Time: 07/12/24  7:01 AM  Result Value Ref Range   Rom Plus NEGATIVE     Patient Active Problem List   Diagnosis Date Noted  Labor and delivery, indication for care 07/12/2024   Fetal arrhythmia - RESOLVED 02/26/2024   Atypical findings NIPS 01/22/2024   Family history of genetic disease 01/22/2024   Encounter for supervision of normal pregnancy, antepartum 01/04/2024   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 H5E8978 at [redacted]w[redacted]d here for SOL  #Labor:Expectant management. AROM if needed  #Pain: Epidural #FWB: Cat 1 #GBS status:  negative #Feeding: Breastmilk  #Reproductive Life planning: Condoms #Circ:  yes- consented for procedure  #Hx of retained POC: inspect placenta and low threshold for US .   Suzen Maryan Masters, MD  07/12/2024, 10:17 AM       [1]  Allergies Allergen Reactions   Bee Venom Swelling   Porcine (Pork) Protein-Containing Drug Products

## 2024-07-12 NOTE — MAU Provider Note (Signed)
 Joan Jackson is a 29 y.o. G8P1021 female at [redacted]w[redacted]d  Seen by Provider  @ 0800 SVE by RN: Dilation: 4.5 Effacement (%): 80 Station: Ballotable Exam by:: L. Mearle Drew NP NST: FHR baseline 145- bpm, Variability: moderate, Accelerations:present, Decelerations:  Absent= Cat/ Reactive Toco: irregular     Patient previously seen by RN for labor check at 0630 this AM At 06 40 patient was checked by RN and found to be 3/50/-3 and a ROM plus was send to rule out rupture.  Results for orders placed or performed during the hospital encounter of 07/12/24 (from the past 24 hours)  POCT fern test     Status: None   Collection Time: 07/12/24  6:57 AM  Result Value Ref Range   POCT Fern Test Negative = intact amniotic membranes   Rupture of Membrane (ROM) Plus     Status: None   Collection Time: 07/12/24  7:01 AM  Result Value Ref Range   Rom Plus NEGATIVE     At 0800 this morning patient appeared more uncomfortable with contractions and has a scheduled induction for 07/16/2024.  I personally evaluated the patient and found her to be 4.5/80/ballotable Vertex on transabdominal ultrasound confirmed.  I spoke with the L&D team Dr. Eldonna PILA attending-at this time we will plan for patient to ambulate and recheck in 1 hour.  If patient continues to make change we will consider admission for augmentation of labor.   Patient returned from ambulating at [phone] and is more uncomfortable.  SVE 6/80/-3 Bulging bag  D/W Dr Magali ( ON Fellow ) on LD  Admit for Labor and Epidural at patient request Orders to follow

## 2024-07-12 NOTE — Lactation Note (Signed)
 This note was copied from a baby's chart. Lactation Consultation Note  Patient Name: Joan Jackson Date: 07/12/2024 Age:31 hours Reason for consult: Initial assessment;Mother's request;Term;Maternal endocrine disorder;Breastfeeding assistance;RN request  P2- RN informed LC that MOB was requesting a nipple shield and latching assistance. Per MOB, she used a nipple shield with her first and it makes latching easier. LC offered to assist with latching without the shield first. MOB was receptive. MOB wanted to latch in the side lying position. Infant latched deep with flanged lips, sucked a few times, then came off screaming. MOB again asked for the shield. LC brought a 20 mm shield and placed it. LC also provided a manual pump and 18 mm flange. Infant latched and then popped off screaming again. MOB switched to the football hold on the right breast. Infant latched, sucked a few times and popped of screaming. MOB switched to the cross cradle on the right breast, infant did the same thing. MOB switched infant to the left breast in the cross cradle with the nipple shield. LC placed a few mL of formula into the shield. Infant latched and started nursing. Infant was still nursing when [facility] left the room. LC encouraged MOB to use the formula as a snack if she needs it to keep him latched.  LC reviewed the first 24 hr birthday nap, day 2 cluster feeding, feeding infant on cue 8-12x in 24 hrs, not allowing infant to go over 3 hrs without a feeding, CDC milk storage guidelines, LC services handout and engorgement/breast care. LC encouraged MOB to call for further assistance as needed.  Maternal Data Has patient been taught Hand Expression?: No Does the patient have breastfeeding experience prior to this delivery?: Yes How long did the patient breastfeed?: 4-5 months of latching and formula feeding, then switched to formula only  Feeding Mother's Current Feeding Choice: Breast Milk and Formula  LATCH  Score Latch: Repeated attempts needed to sustain latch, nipple held in mouth throughout feeding, stimulation needed to elicit sucking reflex.  Audible Swallowing: Spontaneous and intermittent  Type of Nipple: Everted at rest and after stimulation  Comfort (Breast/Nipple): Soft / non-tender  Hold (Positioning): Assistance needed to correctly position infant at breast and maintain latch.  LATCH Score: 8   Lactation Tools Discussed/Used Tools: Pump;Flanges;Nipple Shields Nipple shield size: 20 (MOB request) Flange Size: 18 Breast pump type: Manual Pump Education: Setup, frequency, and cleaning;Milk Storage Reason for Pumping: nipple shield use Pumping frequency: 15-20 min every 3 hrs  Interventions Interventions: Breast feeding basics reviewed;Assisted with latch;Breast compression;Adjust position;Support pillows;Position options;Hand pump;Education;LC Services brochure  Discharge Discharge Education: Engorgement and breast care;Warning signs for feeding baby Pump: DEBP;Manual;Personal  Consult Status Consult Status: Follow-up Date: 07/13/24 Follow-up type: In-patient    Recardo Hoit BS, IBCLC 07/12/2024, 9:31 PM

## 2024-07-12 NOTE — Anesthesia Preprocedure Evaluation (Addendum)
 "                                  Anesthesia Evaluation  Patient identified by MRN, date of birth, ID band Patient awake    Reviewed: Allergy & Precautions, Patient's Chart, lab work & pertinent test results  Airway Mallampati: I       Dental no notable dental hx.    Pulmonary    Pulmonary exam normal        Cardiovascular negative cardio ROS Normal cardiovascular exam     Neuro/Psych    GI/Hepatic   Endo/Other    Renal/GU      Musculoskeletal   Abdominal   Peds  Hematology   Anesthesia Other Findings   Reproductive/Obstetrics (+) Pregnancy                              Anesthesia Physical Anesthesia Plan  ASA: 2  Anesthesia Plan: Epidural   Post-op Pain Management:    Induction:   PONV Risk Score and Plan: 0  Airway Management Planned: Natural Airway  Additional Equipment: None  Intra-op Plan:   Post-operative Plan:   Informed Consent: I have reviewed the patients History and Physical, chart, labs and discussed the procedure including the risks, benefits and alternatives for the proposed anesthesia with the patient or authorized representative who has indicated his/her understanding and acceptance.       Plan Discussed with:   Anesthesia Plan Comments: (Lab Results      Component                Value               Date                      WBC                      11.0 (H)            07/12/2024                HGB                      11.7 (L)            07/12/2024                HCT                      36.3                07/12/2024                MCV                      83.4                07/12/2024                PLT                      231                 07/12/2024           )  Anesthesia Quick Evaluation  "

## 2024-07-12 NOTE — Progress Notes (Signed)
 BP 119/78   Pulse (!) 109   Temp 97.9 F (36.6 C) (Oral)   Resp 16   LMP 10/03/2023   SpO2 99%   Offered vaginal exam Cervical exam: c/c/0 with BB Offered AROM- accepted AROM performed with clear fluid Await vaginal delivery

## 2024-07-12 NOTE — Progress Notes (Signed)
 Pt informed that the ultrasound is considered a limited OB ultrasound and is not intended to be a complete ultrasound exam.  Patient also informed that the ultrasound is not being completed with the intent of assessing for fetal or placental anomalies or any pelvic abnormalities.  Explained that the purpose of todays ultrasound is to assess for  presentation.  Patient acknowledges the purpose of the exam and the limitations of the study. Patient vertex by US  confirmed by L. Cooleen NP

## 2024-07-12 NOTE — MAU Note (Signed)
 Patient is [redacted]w[redacted]d, [id number] presents with complaint of worsening contractions since last night. Patient states that she has noticed some light bleeding and some watery fluid; she is not sure if her water broke but she does not feel constant watery fluids. Patient rates her contraction pain a 6 on a 0-10 pain scale. Patient has +FM. VSS. Monitor applied and assessing.

## 2024-07-12 NOTE — Discharge Summary (Signed)
 "    Postpartum Discharge Summary  Date of Service updated    Patient Name: Joan Jackson DOB: [birth year] MRN: [id number]  Date of admission: 07/12/2024 Delivery date:07/12/2024 Delivering provider: CLEATUS MOCCASIN Date of discharge: 07/14/2024  Admitting diagnosis: Labor and delivery, indication for care [O75.9] Intrauterine pregnancy: [redacted]w[redacted]d     Secondary diagnosis:  Principal Problem:   Labor and delivery, indication for care Active Problems:   Vaginal delivery  Additional problems: None    Discharge diagnosis: Term Pregnancy Delivered                                              Post partum procedures:None Augmentation: AROM Complications: None  Hospital course: Onset of Labor With Vaginal Delivery      29 y.o. yo [id number] at [redacted]w[redacted]d was admitted in Active Labor on 07/12/2024. Labor course was complicated by nothing. Membrane Rupture Time/Date: 1:55 PM,07/12/2024  Delivery Method:Vaginal, Spontaneous Operative Delivery:N/A Episiotomy: None Lacerations:  None;Periurethral Patient had a postpartum course complicated by nothing.  She is ambulating, tolerating a regular diet, passing flatus, and urinating well. Patient is discharged home in stable condition on 07/14/2024.  Newborn Data: Birth date:07/12/2024 Birth time:3:39 PM Gender:Female Living status:Living Apgars:8 ,9  Weight:3360 g  Magnesium Sulfate received: No BMZ received: No Rhophylac:N/A MMR:N/A T-DaP:Given prenatally Flu: Yes RSV Vaccine received: Yes Transfusion:No  Immunizations received: Immunization History  Administered Date(s) Administered    sv, Bivalent, Protein Subunit Rsvpref,pf [place]) 06/11/2024   Influenza, Seasonal, Injecte, Preservative Fre 03/19/2024   Influenza,inj,Quad PF,6+ Mos 04/21/2021   PFIZER Comirnaty(Gray Top)Covid-19 Tri-Sucrose Vaccine 01/01/2020, 01/22/2020   Tdap 12/14/2020, 04/17/2024    Physical exam  Vitals:   07/13/24 0249 07/13/24 0624 07/13/24 2114 07/14/24 0515  BP: 99/62  99/64 117/76 118/69  Pulse: 79 99    Resp: 18 18 18 16   Temp: 97.9 F (36.6 C) 97.8 F (36.6 C) 98.1 F (36.7 C) 98.1 F (36.7 C)  TempSrc: Oral  Oral Oral  SpO2: 97% 99%  98%   General: alert, cooperative, and no distress Lochia: appropriate Uterine Fundus: firm Incision: N/A DVT Evaluation: No evidence of DVT seen on physical exam. Labs: Lab Results  Component Value Date   WBC 11.0 (H) 07/12/2024   HGB 11.7 (L) 07/12/2024   HCT 36.3 07/12/2024   MCV 83.4 07/12/2024   PLT 231 07/12/2024       No data to display         Edinburgh Score:    07/13/2024    3:30 PM  Edinburgh Postnatal Depression Scale Screening Tool  I have been able to laugh and see the funny side of things. 0  I have looked forward with enjoyment to things. 0  I have blamed myself unnecessarily when things went wrong. 0  I have been anxious or worried for no good reason. 0  I have felt scared or panicky for no good reason. 0  Things have been getting on top of me. 0  I have been so unhappy that I have had difficulty sleeping. 0  I have felt sad or miserable. 0  I have been so unhappy that I have been crying. 0  The thought of harming myself has occurred to me. 0  Edinburgh Postnatal Depression Scale Total 0   Edinburgh Postnatal Depression Scale Total: 0   After visit meds:  Allergies as of 07/14/2024  Reactions   Bee Venom Swelling   Porcine (pork) Protein-containing Drug Products         Medication List     TAKE these medications    acetaminophen  325 MG tablet Commonly known as: Tylenol  Take 2 tablets (650 mg total) by mouth every 4 (four) hours as needed (for pain scale < 4).   cetirizine  10 MG tablet Commonly known as: ZyrTEC  Allergy Take 1 tablet (10 mg total) by mouth at bedtime.   ibuprofen  600 MG tablet Commonly known as: ADVIL  Take 1 tablet (600 mg total) by mouth every 6 (six) hours.   pantoprazole  20 MG tablet Commonly known as: Protonix  Take 1 tablet (20 mg  total) by mouth daily.   PRENATAL PO Take 1 tablet by mouth daily.         Discharge home in stable condition Infant Feeding: Breast Infant Disposition:home with mother Discharge instruction: per After Visit Summary and Postpartum booklet. Activity: Advance as tolerated. Pelvic rest for 6 weeks.  Diet: routine diet Future Appointments: Future Appointments  Date Time Provider Department Center  08/28/2024  1:50 PM Anyanwu, Gloris LABOR, MD [facility]   Follow up Visit:  Follow-up Information     [facility] Follow up in 6 week(s).   Specialty: Obstetrics and Gynecology Contact information: [address], [address] [place]  [address] [phone]               PP visit already made at [facility].   Please schedule this patient for a In person postpartum visit in 6 weeks with the following provider: Any provider. Additional Postpartum F/U:None   Low risk pregnancy complicated by: NA Delivery mode:  Vaginal, Spontaneous Anticipated Birth Control:  Condoms   07/14/2024 Vina Solian, MD    "

## 2024-07-12 NOTE — Progress Notes (Signed)
 Bedside US  done to confirm all placenta delivered for pt reassurance. Strip visualized. No flow in the area of the strip. Also noted to pt I inspected placenta and intact and it delivered spontaneously. She was reassured.   Vina Solian, MD Attending Obstetrician & Gynecologist, [facility] for [facility], [facility]

## 2024-07-12 NOTE — Anesthesia Procedure Notes (Signed)
 Epidural Patient location during procedure: OB Start time: 07/12/2024 10:43 AM End time: 07/12/2024 10:49 AM  Staffing Anesthesiologist: Tilford Franky BIRCH, MD Performed: anesthesiologist   Preanesthetic Checklist Completed: patient identified, IV checked, site marked, risks and benefits discussed, surgical consent, monitors and equipment checked, pre-op evaluation and timeout performed  Epidural Patient position: sitting Prep: DuraPrep Patient monitoring: heart rate, continuous pulse ox and blood pressure Approach: midline Location: L3-L4 Injection technique: LOR saline  Needle:  Needle type: Tuohy  Needle gauge: 17 G Needle length: 9 cm Catheter type: closed end flexible Catheter size: 20 Guage Test dose: negative and 1.5% lidocaine   Assessment Events: blood not aspirated, no cerebrospinal fluid, injection not painful, no injection resistance and no paresthesia  Additional Notes LOR @ 5  Patient identified. Risks/Benefits/Options discussed with patient including but not limited to bleeding, infection, nerve damage, paralysis, failed block, incomplete pain control, headache, blood pressure changes, nausea, vomiting, reactions to medications, itching and postpartum back pain. Confirmed with bedside nurse the patient's most recent platelet count. Confirmed with patient that they are not currently taking any anticoagulation, have any bleeding history or any family history of bleeding disorders. Patient expressed understanding and wished to proceed. All questions were answered. Sterile technique was used throughout the entire procedure. Please see nursing notes for vital signs. Test dose was given through epidural catheter and negative prior to continuing to dose epidural or start infusion. Warning signs of high block given to the patient including shortness of breath, tingling/numbness in hands, complete motor block, or any concerning symptoms with instructions to call for help. Patient was  given instructions on fall risk and not to get out of bed. All questions and concerns addressed with instructions to call with any issues or inadequate analgesia.    Reason for block:procedure for pain

## 2024-07-13 MED ORDER — ACETAMINOPHEN 325 MG PO TABS: 650.0000 mg | ORAL_TABLET | ORAL | 0 refills | Status: AC | PRN

## 2024-07-13 MED ORDER — IBUPROFEN 600 MG PO TABS: 600.0000 mg | ORAL_TABLET | Freq: Four times a day (QID) | ORAL | 0 refills | Status: AC

## 2024-07-13 NOTE — Anesthesia Postprocedure Evaluation (Signed)
"   Anesthesia Post Note  Patient: Joan Jackson  Procedure(s) Performed: AN AD HOC LABOR EPIDURAL     Patient location during evaluation: Mother Baby Anesthesia Type: Epidural Level of consciousness: awake and alert and oriented Pain management: satisfactory to patient Vital Signs Assessment: post-procedure vital signs reviewed and stable Respiratory status: respiratory function stable Cardiovascular status: stable Postop Assessment: no headache, no backache, epidural receding, patient able to bend at knees, no signs of nausea or vomiting, adequate PO intake and able to ambulate Anesthetic complications: no   No notable events documented.  Last Vitals:  Vitals:   07/13/24 0249 07/13/24 0624  BP: 99/62 99/64  Pulse: 79 99  Resp: 18 18  Temp: 36.6 C 36.6 C  SpO2: 97% 99%    Last Pain:  Vitals:   07/13/24 0625  TempSrc:   PainSc: 5    Pain Goal: Patients Stated Pain Goal: 0 (07/12/24 9371)                 Mily Malecki      "

## 2024-07-13 NOTE — Lactation Note (Signed)
 This note was copied from a baby's chart. Lactation Consultation Note  Patient Name: Joan Jackson Unijb'd Date: 07/13/2024 Age:31 hours   P2. Attempted to see mom but everyone was sleeping.  Maternal Data    Feeding Nipple Type: Slow - flow  LATCH Score                    Lactation Tools Discussed/Used    Interventions    Discharge    Consult Status      Gunther Zawadzki G 07/13/2024, 10:20 PM

## 2024-07-13 NOTE — Progress Notes (Signed)
 Post Partum Day 1 s/p SVD Subjective: no complaints, up ad lib, voiding, and tolerating PO  Objective: Blood pressure 99/64, pulse 99, temperature 97.8 F (36.6 C), resp. rate 18, last menstrual period 10/03/2023, SpO2 99%, unknown if currently breastfeeding.  Physical Exam:  General: alert, cooperative, and no distress Lochia: appropriate Uterine Fundus: firm Incision: NA DVT Evaluation: No evidence of DVT seen on physical exam.  Recent Labs    07/12/24 0958  HGB 11.7*  HCT 36.3    Assessment/Plan: Postpartum - Contraception: condoms - MOF: breast - Rh status: rh pos - Rubella status: immune - Dispo: Requests early d/c - Consults: Lactation  Neonatal - Doing well - Circumcision: Desires circ. Consent already done.    LOS: 1 day   Vina Solian 07/13/2024, 10:25 AM

## 2024-07-14 NOTE — Lactation Note (Signed)
 This note was copied from a baby's chart. Lactation Consultation Note  Patient Name: Joan Jackson Date: 07/14/2024 Age:31 hours Reason for consult: Follow-up assessment;Term  P2 mom awake giving formula to baby. Mom stated she BF on both breast but baby didn't appear satisfied so she is giving formula. Mom stated she can feel her milk coming soon her breast is tingling during feeding. Praised mom. LC stated when your milk comes in you won't need the formula. Mom stated it is to help until her milk comes in good. Encouraged mom if she has questions or needs assistance to call. Maternal Data    Feeding Mother's Current Feeding Choice: Breast Milk and Formula Nipple Type: Slow - flow  LATCH Score Latch:  (LC didn't see latch)                  Lactation Tools Discussed/Used    Interventions Interventions: Education  Discharge    Consult Status Consult Status: Follow-up Date: 07/14/24 Follow-up type: In-patient    Merion Grimaldo G 07/14/2024, 4:13 AM

## 2024-07-16 ENCOUNTER — Inpatient Hospital Stay (HOSPITAL_COMMUNITY)

## 2024-07-16 ENCOUNTER — Inpatient Hospital Stay (HOSPITAL_COMMUNITY): Admission: RE | Admit: 2024-07-16 | Source: Home / Self Care | Admitting: Family Medicine

## 2024-07-20 ENCOUNTER — Telehealth (HOSPITAL_COMMUNITY): Payer: Self-pay

## 2024-07-20 NOTE — Telephone Encounter (Signed)
 07/20/2024 1216  Name: Joan Jackson MRN: [id number] DOB: May 06, 1996  Reason for Call:  Transition of Care Hospital Discharge Call  Contact Status: Patient Contact Status: Message  Language assistant needed:          Follow-Up Questions:    Van Postnatal Depression Scale:  In the Past 7 Days:    PHQ2-9 Depression Scale:     Discharge Follow-up:    Post-discharge interventions: NA  Signature  Rosaline Deretha PEAK

## 2024-08-28 ENCOUNTER — Ambulatory Visit: Admitting: Obstetrics & Gynecology
